# Patient Record
Sex: Female | Born: 1955 | Race: White | Hispanic: No | Marital: Married | Smoking: Never smoker
Health system: Southern US, Community
[De-identification: ages and names within clinical notes are randomized; demographics above are authoritative.]

## PROBLEM LIST (undated history)

## (undated) DIAGNOSIS — I10 Essential (primary) hypertension: Secondary | ICD-10-CM

## (undated) DIAGNOSIS — R233 Spontaneous ecchymoses: Secondary | ICD-10-CM

## (undated) DIAGNOSIS — M1990 Unspecified osteoarthritis, unspecified site: Secondary | ICD-10-CM

## (undated) DIAGNOSIS — I8390 Asymptomatic varicose veins of unspecified lower extremity: Secondary | ICD-10-CM

## (undated) DIAGNOSIS — K219 Gastro-esophageal reflux disease without esophagitis: Secondary | ICD-10-CM

## (undated) DIAGNOSIS — E119 Type 2 diabetes mellitus without complications: Secondary | ICD-10-CM

## (undated) DIAGNOSIS — R112 Nausea with vomiting, unspecified: Secondary | ICD-10-CM

## (undated) DIAGNOSIS — Z9889 Other specified postprocedural states: Secondary | ICD-10-CM

## (undated) DIAGNOSIS — R238 Other skin changes: Secondary | ICD-10-CM

## (undated) DIAGNOSIS — R9431 Abnormal electrocardiogram [ECG] [EKG]: Secondary | ICD-10-CM

## (undated) HISTORY — PX: ABDOMINAL HYSTERECTOMY: SHX81

## (undated) HISTORY — PX: OTHER SURGICAL HISTORY: SHX169

## (undated) HISTORY — PX: COLONOSCOPY: SHX174

## (undated) HISTORY — PX: WISDOM TOOTH EXTRACTION: SHX21

---

## 1999-10-10 ENCOUNTER — Other Ambulatory Visit: Admission: RE | Admit: 1999-10-10 | Discharge: 1999-10-10 | Payer: Self-pay | Admitting: Obstetrics and Gynecology

## 2000-03-05 ENCOUNTER — Encounter: Payer: Self-pay | Admitting: Obstetrics and Gynecology

## 2000-03-05 ENCOUNTER — Encounter: Admission: RE | Admit: 2000-03-05 | Discharge: 2000-03-05 | Payer: Self-pay | Admitting: Obstetrics and Gynecology

## 2000-10-15 ENCOUNTER — Other Ambulatory Visit: Admission: RE | Admit: 2000-10-15 | Discharge: 2000-10-15 | Payer: Self-pay | Admitting: Obstetrics and Gynecology

## 2001-04-22 ENCOUNTER — Encounter: Admission: RE | Admit: 2001-04-22 | Discharge: 2001-04-22 | Payer: Self-pay | Admitting: Obstetrics and Gynecology

## 2001-04-22 ENCOUNTER — Encounter: Payer: Self-pay | Admitting: Obstetrics and Gynecology

## 2001-10-28 ENCOUNTER — Other Ambulatory Visit: Admission: RE | Admit: 2001-10-28 | Discharge: 2001-10-28 | Payer: Self-pay | Admitting: Obstetrics and Gynecology

## 2002-04-28 ENCOUNTER — Encounter: Payer: Self-pay | Admitting: Obstetrics and Gynecology

## 2002-04-28 ENCOUNTER — Encounter: Admission: RE | Admit: 2002-04-28 | Discharge: 2002-04-28 | Payer: Self-pay | Admitting: Obstetrics and Gynecology

## 2002-05-05 ENCOUNTER — Encounter: Payer: Self-pay | Admitting: Obstetrics and Gynecology

## 2002-05-05 ENCOUNTER — Encounter: Admission: RE | Admit: 2002-05-05 | Discharge: 2002-05-05 | Payer: Self-pay | Admitting: Obstetrics and Gynecology

## 2003-05-11 ENCOUNTER — Encounter: Payer: Self-pay | Admitting: Obstetrics and Gynecology

## 2003-05-11 ENCOUNTER — Encounter: Admission: RE | Admit: 2003-05-11 | Discharge: 2003-05-11 | Payer: Self-pay | Admitting: Obstetrics and Gynecology

## 2003-07-23 ENCOUNTER — Other Ambulatory Visit: Admission: RE | Admit: 2003-07-23 | Discharge: 2003-07-23 | Payer: Self-pay | Admitting: Obstetrics and Gynecology

## 2004-05-16 ENCOUNTER — Encounter: Admission: RE | Admit: 2004-05-16 | Discharge: 2004-05-16 | Payer: Self-pay | Admitting: Obstetrics and Gynecology

## 2005-05-24 ENCOUNTER — Encounter: Admission: RE | Admit: 2005-05-24 | Discharge: 2005-05-24 | Payer: Self-pay | Admitting: Obstetrics and Gynecology

## 2006-06-07 ENCOUNTER — Encounter: Admission: RE | Admit: 2006-06-07 | Discharge: 2006-06-07 | Payer: Self-pay | Admitting: Obstetrics and Gynecology

## 2007-06-10 ENCOUNTER — Encounter: Admission: RE | Admit: 2007-06-10 | Discharge: 2007-06-10 | Payer: Self-pay | Admitting: Obstetrics and Gynecology

## 2008-06-12 ENCOUNTER — Encounter: Admission: RE | Admit: 2008-06-12 | Discharge: 2008-06-12 | Payer: Self-pay | Admitting: Obstetrics and Gynecology

## 2009-06-16 ENCOUNTER — Encounter: Admission: RE | Admit: 2009-06-16 | Discharge: 2009-06-16 | Payer: Self-pay | Admitting: Obstetrics and Gynecology

## 2009-06-22 ENCOUNTER — Encounter: Admission: RE | Admit: 2009-06-22 | Discharge: 2009-06-22 | Payer: Self-pay | Admitting: Obstetrics and Gynecology

## 2010-06-20 ENCOUNTER — Encounter: Admission: RE | Admit: 2010-06-20 | Discharge: 2010-06-20 | Payer: Self-pay | Admitting: Obstetrics and Gynecology

## 2010-10-05 ENCOUNTER — Encounter: Admission: RE | Admit: 2010-10-05 | Discharge: 2010-10-05 | Payer: Self-pay | Admitting: Obstetrics and Gynecology

## 2010-12-26 ENCOUNTER — Encounter: Payer: Self-pay | Admitting: Obstetrics and Gynecology

## 2011-05-22 ENCOUNTER — Other Ambulatory Visit: Payer: Self-pay | Admitting: Obstetrics and Gynecology

## 2011-05-22 DIAGNOSIS — Z1231 Encounter for screening mammogram for malignant neoplasm of breast: Secondary | ICD-10-CM

## 2011-06-28 ENCOUNTER — Ambulatory Visit: Payer: Self-pay

## 2011-07-03 ENCOUNTER — Ambulatory Visit
Admission: RE | Admit: 2011-07-03 | Discharge: 2011-07-03 | Disposition: A | Discharge status: Home / Self Care | Payer: Commercial Indemnity | Source: Ambulatory Visit | Attending: Obstetrics and Gynecology | Admitting: Obstetrics and Gynecology

## 2011-07-03 DIAGNOSIS — Z1231 Encounter for screening mammogram for malignant neoplasm of breast: Secondary | ICD-10-CM

## 2012-06-12 ENCOUNTER — Other Ambulatory Visit: Payer: Self-pay | Admitting: Obstetrics and Gynecology

## 2012-06-12 DIAGNOSIS — M8580 Other specified disorders of bone density and structure, unspecified site: Secondary | ICD-10-CM

## 2012-06-12 DIAGNOSIS — Z1231 Encounter for screening mammogram for malignant neoplasm of breast: Secondary | ICD-10-CM

## 2012-10-07 ENCOUNTER — Ambulatory Visit
Admission: RE | Admit: 2012-10-07 | Discharge: 2012-10-07 | Disposition: A | Discharge status: Home / Self Care | Payer: Commercial Indemnity | Source: Ambulatory Visit | Attending: Obstetrics and Gynecology | Admitting: Obstetrics and Gynecology

## 2012-10-07 DIAGNOSIS — Z1231 Encounter for screening mammogram for malignant neoplasm of breast: Secondary | ICD-10-CM

## 2012-10-07 DIAGNOSIS — M8580 Other specified disorders of bone density and structure, unspecified site: Secondary | ICD-10-CM

## 2013-09-08 ENCOUNTER — Other Ambulatory Visit: Payer: Self-pay

## 2013-09-08 DIAGNOSIS — Z1231 Encounter for screening mammogram for malignant neoplasm of breast: Secondary | ICD-10-CM

## 2013-10-10 ENCOUNTER — Ambulatory Visit: Payer: Commercial Indemnity

## 2013-10-29 ENCOUNTER — Ambulatory Visit
Admission: RE | Admit: 2013-10-29 | Discharge: 2013-10-29 | Disposition: A | Discharge status: Home / Self Care | Payer: Managed Care, Other (non HMO) | Source: Ambulatory Visit

## 2013-10-29 DIAGNOSIS — Z1231 Encounter for screening mammogram for malignant neoplasm of breast: Secondary | ICD-10-CM

## 2013-10-29 NOTE — Progress Notes (Signed)
Need orders in EPIC.  Surgery scheduled for 11/05/13.  Thank You.

## 2013-10-31 ENCOUNTER — Encounter (HOSPITAL_COMMUNITY): Payer: Self-pay | Admitting: Pharmacy Technician

## 2013-11-04 ENCOUNTER — Other Ambulatory Visit: Payer: Self-pay | Admitting: Orthopedic Surgery

## 2013-11-04 ENCOUNTER — Encounter (HOSPITAL_COMMUNITY)
Admission: RE | Admit: 2013-11-04 | Discharge: 2013-11-04 | Disposition: A | Discharge status: Home / Self Care | Payer: Managed Care, Other (non HMO) | Source: Ambulatory Visit | Attending: Orthopedic Surgery | Admitting: Orthopedic Surgery

## 2013-11-04 ENCOUNTER — Encounter (HOSPITAL_COMMUNITY): Payer: Self-pay

## 2013-11-04 ENCOUNTER — Encounter (INDEPENDENT_AMBULATORY_CARE_PROVIDER_SITE_OTHER): Payer: Self-pay

## 2013-11-04 ENCOUNTER — Other Ambulatory Visit: Payer: Self-pay

## 2013-11-04 DIAGNOSIS — I8390 Asymptomatic varicose veins of unspecified lower extremity: Secondary | ICD-10-CM

## 2013-11-04 HISTORY — DX: Spontaneous ecchymoses: R23.3

## 2013-11-04 HISTORY — DX: Nausea with vomiting, unspecified: R11.2

## 2013-11-04 HISTORY — DX: Unspecified osteoarthritis, unspecified site: M19.90

## 2013-11-04 HISTORY — DX: Asymptomatic varicose veins of unspecified lower extremity: I83.90

## 2013-11-04 HISTORY — DX: Other skin changes: R23.8

## 2013-11-04 HISTORY — DX: Essential (primary) hypertension: I10

## 2013-11-04 HISTORY — DX: Abnormal electrocardiogram (ECG) (EKG): R94.31

## 2013-11-04 HISTORY — DX: Other specified postprocedural states: Z98.890

## 2013-11-04 LAB — BASIC METABOLIC PANEL
Chloride: 99 mEq/L (ref 96–112)
Creatinine, Ser: 0.89 mg/dL (ref 0.50–1.10)
GFR calc Af Amer: 82 mL/min — ABNORMAL LOW (ref >90)
Sodium: 136 mEq/L (ref 135–145)

## 2013-11-04 LAB — CBC
Hemoglobin: 13 g/dL (ref 12.0–15.0)
MCV: 92.1 fL (ref 78.0–100.0)
RDW: 12.4 % (ref 11.5–15.5)
WBC: 6 10*3/uL (ref 4.0–10.5)

## 2013-11-04 NOTE — H&P (Signed)
  CC- Michaela Wood is a 56 y.o. female who presents with right knee pain.  HPI- . Knee Pain: Patient presents with knee pain involving the  right knee. Onset of the symptoms was several years ago. Inciting event: none known. Current symptoms include giving out, pain located medially, stiffness and swelling. Pain is aggravated by lateral movements, pivoting, rising after sitting and squatting.  Patient has had no prior knee problems. Evaluation to date: MRI: abnormal medial and lateral meniscal tears. Treatment to date: corticosteroid injection which was not very effective.  Past Medical History  Diagnosis Date  . PONV (postoperative nausea and vomiting)     only with wisdom teeth extraction  . Hypertension   . Abnormal EKG     "told bundle branch blockage"-EKG brought in 09-11-07 copy with chart  . Varicose veins 11-04-13    oral varicose veins-surgically excised  . Arthritis   . Bruises easily     hx. of. Fell" knee gave way" Right cheek with recent suture removal  . Varicose veins 11-04-13    bilateral legs-no problems    Past Surgical History  Procedure Laterality Date  . Colonoscopy    . Wisdom tooth extraction    . Childbirth      x2 NVD    Prior to Admission medications   Medication Sig Start Date End Date Taking? Authorizing Provider  Estradiol-Norethindrone Acet (ACTIVELLA) 0.5-0.1 MG per tablet Take 1 tablet by mouth daily.   Yes Historical Provider, MD  lisinopril-hydrochlorothiazide (PRINZIDE,ZESTORETIC) 20-25 MG per tablet Take 0.5 tablets by mouth daily with breakfast.   Yes Historical Provider, MD  etodolac (LODINE) 400 MG tablet Take 400 mg by mouth 2 (two) times daily.    Historical Provider, MD   KNEE EXAM antalgic gait, soft tissue tenderness over medial and lateral joint lines, effusion, negative drawer sign, collateral ligaments intact  Physical Examination: General appearance - alert, well appearing, and in no distress Mental status - alert, oriented to  person, place, and time Chest - clear to auscultation, no wheezes, rales or rhonchi, symmetric air entry Heart - normal rate, regular rhythm, normal S1, S2, no murmurs, rubs, clicks or gallops Abdomen - soft, nontender, nondistended, no masses or organomegaly Neurological - alert, oriented, normal speech, no focal findings or movement disorder noted   Asessment/Plan--- Right knee medial and lateral meniscal tears- - Plan right knee arthroscopy with meniscal debridement. Procedure risks and potential comps discussed with patient who elects to proceed. Goals are decreased pain and increased function with a high likelihood of achieving both

## 2013-11-04 NOTE — Progress Notes (Signed)
Preoperative surgical orders have been place into the Epic hospital system for Michaela Wood on 11/04/2013, 10:18 AM  by Patrica Duel for surgery on 11-05-2013.  Preop Knee Scope orders including IV Tylenol and IV Decadron as long as there are no contraindications to the above medications. Avel Peace, PA-C

## 2013-11-04 NOTE — Pre-Procedure Instructions (Signed)
11-04-13 EKG done

## 2013-11-04 NOTE — Patient Instructions (Addendum)
20 Michaela Wood  11/04/2013   Your procedure is scheduled on:   11-05-2013  Report to [facility] at   2:00      PM.  Call this number if you have problems the morning of surgery: [phone]  Or Presurgical Testing [phone](Heliodoro Domagalski)      Do not eat food:After Midnight.  May have clear liquids:up to 6 Hours before arrival. Nothing after : 1000 AM  Clear liquids include soda, tea, black coffee, apple or grape juice, broth.  Take these medicines the morning of surgery with A SIP OF WATER: none.   Do not wear jewelry, make-up or nail polish.  Do not wear lotions, powders, or perfumes. You may wear deodorant.  Do not shave 12 hours prior to first CHG shower(legs and under arms).(face and neck okay.)  Do not bring valuables to the hospital.  Contacts, dentures or removable bridgework, body piercing, hair pins may not be worn into surgery.  Leave suitcase in the car. After surgery it may be brought to your room.  For patients admitted to the hospital, checkout time is 11:00 AM the day of discharge.   Patients discharged the day of surgery will not be allowed to drive home. Must have responsible person with you x 24 hours once discharged.  Name and phone number of your driver: [name] -spouse [phone] cell  Special Instructions: CHG(Chlorhedine 4%-"Hibiclens","Betasept","Aplicare") Shower Use Special Wash: see special instructions.(avoid face and genitals)   Please read over the following fact sheets that you were given:Incentive Spirometry Instruction.    Failure to follow these instructions may result in Cancellation of your surgery.   Patient signature_______________________________________________________

## 2013-11-04 NOTE — Progress Notes (Signed)
11-04-13 1515 Candy of Dr. Deri Fuelling office to inform [employer]. Taking Etodolac for pain, needs to be informed whether to continue or hold until after surgery. Pt. Home # [phone]. W. Kennon Portela

## 2013-11-05 ENCOUNTER — Encounter (HOSPITAL_COMMUNITY)
Admission: RE | Disposition: A | Discharge status: Home / Self Care | Payer: Self-pay | Source: Ambulatory Visit | Attending: Orthopedic Surgery

## 2013-11-05 ENCOUNTER — Encounter (HOSPITAL_COMMUNITY): Payer: Self-pay | Admitting: *Deleted

## 2013-11-05 ENCOUNTER — Ambulatory Visit (HOSPITAL_COMMUNITY)
Admission: RE | Admit: 2013-11-05 | Discharge: 2013-11-05 | Disposition: A | Discharge status: Home / Self Care | Payer: Managed Care, Other (non HMO) | Source: Ambulatory Visit | Attending: Orthopedic Surgery | Admitting: Orthopedic Surgery

## 2013-11-05 ENCOUNTER — Ambulatory Visit (HOSPITAL_COMMUNITY): Payer: Managed Care, Other (non HMO)

## 2013-11-05 ENCOUNTER — Ambulatory Visit (HOSPITAL_COMMUNITY): Payer: Managed Care, Other (non HMO) | Admitting: Anesthesiology

## 2013-11-05 ENCOUNTER — Encounter (HOSPITAL_COMMUNITY): Payer: Managed Care, Other (non HMO) | Admitting: Anesthesiology

## 2013-11-05 DIAGNOSIS — S83249A Other tear of medial meniscus, current injury, unspecified knee, initial encounter: Secondary | ICD-10-CM

## 2013-11-05 DIAGNOSIS — S83289A Other tear of lateral meniscus, current injury, unspecified knee, initial encounter: Secondary | ICD-10-CM | POA: Insufficient documentation

## 2013-11-05 DIAGNOSIS — M9420 Chondromalacia, unspecified site: Secondary | ICD-10-CM | POA: Insufficient documentation

## 2013-11-05 DIAGNOSIS — S83281A Other tear of lateral meniscus, current injury, right knee, initial encounter: Secondary | ICD-10-CM

## 2013-11-05 DIAGNOSIS — M47814 Spondylosis without myelopathy or radiculopathy, thoracic region: Secondary | ICD-10-CM | POA: Insufficient documentation

## 2013-11-05 DIAGNOSIS — X58XXXA Exposure to other specified factors, initial encounter: Secondary | ICD-10-CM | POA: Insufficient documentation

## 2013-11-05 HISTORY — PX: KNEE ARTHROSCOPY: SHX127

## 2013-11-05 SURGERY — ARTHROSCOPY, KNEE
Anesthesia: General | Site: Knee | Laterality: Right

## 2013-11-05 MED ORDER — DEXAMETHASONE SODIUM PHOSPHATE 10 MG/ML IJ SOLN
10.0000 mg | Freq: Once | INTRAMUSCULAR | Status: AC
  Administered 2013-11-05: 10 mg via INTRAVENOUS

## 2013-11-05 MED ORDER — BUPIVACAINE-EPINEPHRINE 0.25% -1:200000 IJ SOLN
INTRAMUSCULAR | Status: DC | PRN
  Administered 2013-11-05: 20 mL

## 2013-11-05 MED ORDER — DEXAMETHASONE SODIUM PHOSPHATE 10 MG/ML IJ SOLN
INTRAMUSCULAR | Status: AC
  Filled 2013-11-05: qty 1

## 2013-11-05 MED ORDER — ACETAMINOPHEN 10 MG/ML IV SOLN
1000.0000 mg | Freq: Once | INTRAVENOUS | Status: AC
  Administered 2013-11-05: 1000 mg via INTRAVENOUS
  Filled 2013-11-05: qty 100

## 2013-11-05 MED ORDER — MIDAZOLAM HCL 2 MG/2ML IJ SOLN
INTRAMUSCULAR | Status: AC
  Filled 2013-11-05: qty 2

## 2013-11-05 MED ORDER — FENTANYL CITRATE 0.05 MG/ML IJ SOLN
INTRAMUSCULAR | Status: AC
  Filled 2013-11-05: qty 5

## 2013-11-05 MED ORDER — MIDAZOLAM HCL 5 MG/5ML IJ SOLN
INTRAMUSCULAR | Status: DC | PRN
  Administered 2013-11-05 (×2): 1 mg via INTRAVENOUS

## 2013-11-05 MED ORDER — FENTANYL CITRATE 0.05 MG/ML IJ SOLN
INTRAMUSCULAR | Status: DC | PRN
  Administered 2013-11-05 (×3): 50 ug via INTRAVENOUS

## 2013-11-05 MED ORDER — ONDANSETRON HCL 4 MG/2ML IJ SOLN
INTRAMUSCULAR | Status: DC | PRN
  Administered 2013-11-05: 4 mg via INTRAVENOUS

## 2013-11-05 MED ORDER — LACTATED RINGERS IR SOLN
Status: DC | PRN
  Administered 2013-11-05: 3000 mL

## 2013-11-05 MED ORDER — CHLORHEXIDINE GLUCONATE 4 % EX LIQD: 60.0000 mL | Freq: Once | CUTANEOUS | Status: DC

## 2013-11-05 MED ORDER — HYDROMORPHONE HCL 2 MG PO TABS: 2.0000 mg | ORAL_TABLET | ORAL | Status: DC | PRN

## 2013-11-05 MED ORDER — PROMETHAZINE HCL 25 MG/ML IJ SOLN: 6.2500 mg | INTRAMUSCULAR | Status: DC | PRN

## 2013-11-05 MED ORDER — PROPOFOL 10 MG/ML IV BOLUS
INTRAVENOUS | Status: AC
  Filled 2013-11-05: qty 20

## 2013-11-05 MED ORDER — FENTANYL CITRATE 0.05 MG/ML IJ SOLN
25.0000 ug | INTRAMUSCULAR | Status: DC | PRN
  Administered 2013-11-05 (×4): 25 ug via INTRAVENOUS

## 2013-11-05 MED ORDER — ONDANSETRON HCL 4 MG/2ML IJ SOLN
INTRAMUSCULAR | Status: AC
  Filled 2013-11-05: qty 2

## 2013-11-05 MED ORDER — FENTANYL CITRATE 0.05 MG/ML IJ SOLN
INTRAMUSCULAR | Status: AC
  Filled 2013-11-05: qty 2

## 2013-11-05 MED ORDER — PROPOFOL 10 MG/ML IV BOLUS
INTRAVENOUS | Status: DC | PRN
  Administered 2013-11-05: 30 mg via INTRAVENOUS
  Administered 2013-11-05: 70 mg via INTRAVENOUS
  Administered 2013-11-05: 130 mg via INTRAVENOUS

## 2013-11-05 MED ORDER — CEFAZOLIN SODIUM-DEXTROSE 2-3 GM-% IV SOLR
INTRAVENOUS | Status: AC
  Filled 2013-11-05: qty 50

## 2013-11-05 MED ORDER — BUPIVACAINE-EPINEPHRINE PF 0.25-1:200000 % IJ SOLN
INTRAMUSCULAR | Status: AC
  Filled 2013-11-05: qty 30

## 2013-11-05 MED ORDER — LIDOCAINE HCL (CARDIAC) 20 MG/ML IV SOLN
INTRAVENOUS | Status: DC | PRN
  Administered 2013-11-05: 60 mg via INTRAVENOUS

## 2013-11-05 MED ORDER — METHOCARBAMOL 500 MG PO TABS: 500.0000 mg | ORAL_TABLET | Freq: Four times a day (QID) | ORAL | Status: DC

## 2013-11-05 MED ORDER — LIDOCAINE HCL (CARDIAC) 20 MG/ML IV SOLN
INTRAVENOUS | Status: AC
  Filled 2013-11-05: qty 5

## 2013-11-05 MED ORDER — 0.9 % SODIUM CHLORIDE (POUR BTL) OPTIME
TOPICAL | Status: DC | PRN
  Administered 2013-11-05: 1000 mL

## 2013-11-05 MED ORDER — SODIUM CHLORIDE 0.9 % IV SOLN: INTRAVENOUS | Status: DC

## 2013-11-05 MED ORDER — LACTATED RINGERS IV SOLN
INTRAVENOUS | Status: DC
  Administered 2013-11-05: 1000 mL via INTRAVENOUS

## 2013-11-05 MED ORDER — CEFAZOLIN SODIUM-DEXTROSE 2-3 GM-% IV SOLR
2.0000 g | INTRAVENOUS | Status: AC
  Administered 2013-11-05: 2 g via INTRAVENOUS

## 2013-11-05 SURGICAL SUPPLY — 30 items
BANDAGE ELASTIC 6 VELCRO ST LF (GAUZE/BANDAGES/DRESSINGS) ×2 IMPLANT
BLADE 4.2CUDA (BLADE) ×2 IMPLANT
CLOTH BEACON ORANGE TIMEOUT ST (SAFETY) ×2 IMPLANT
COUNTER NEEDLE 20 DBL MAG RED (NEEDLE) ×2 IMPLANT
CUFF TOURN SGL QUICK 34 (TOURNIQUET CUFF) ×1
CUFF TRNQT CYL 34X4X40X1 (TOURNIQUET CUFF) ×1 IMPLANT
DRAPE U-SHAPE 47X51 STRL (DRAPES) ×2 IMPLANT
DRSG ADAPTIC 3X8 NADH LF (GAUZE/BANDAGES/DRESSINGS) ×2 IMPLANT
DRSG EMULSION OIL 3X3 NADH (GAUZE/BANDAGES/DRESSINGS) ×2 IMPLANT
DURAPREP 26ML APPLICATOR (WOUND CARE) ×2 IMPLANT
GLOVE BIO SURGEON STRL SZ8 (GLOVE) ×2 IMPLANT
GLOVE BIOGEL PI IND STRL 8 (GLOVE) ×1 IMPLANT
GLOVE BIOGEL PI INDICATOR 8 (GLOVE) ×1
GLOVE SURG SS PI 8.5 STRL IVOR (GLOVE) ×1
GLOVE SURG SS PI 8.5 STRL STRW (GLOVE) ×1 IMPLANT
GOWN PREVENTION PLUS LG XLONG (DISPOSABLE) ×4 IMPLANT
IV NS IRRIG 3000ML ARTHROMATIC (IV SOLUTION) ×6 IMPLANT
MANIFOLD NEPTUNE II (INSTRUMENTS) ×2 IMPLANT
PACK ARTHROSCOPY WL (CUSTOM PROCEDURE TRAY) ×2 IMPLANT
PACK ICE MAXI GEL EZY WRAP (MISCELLANEOUS) ×6 IMPLANT
PAD ABD 8X10 STRL (GAUZE/BANDAGES/DRESSINGS) ×2 IMPLANT
PADDING CAST COTTON 6X4 STRL (CAST SUPPLIES) ×4 IMPLANT
POSITIONER SURGICAL ARM (MISCELLANEOUS) ×2 IMPLANT
SET ARTHROSCOPY TUBING (MISCELLANEOUS) ×1
SET ARTHROSCOPY TUBING LN (MISCELLANEOUS) ×1 IMPLANT
SUT ETHILON 4 0 PS 2 18 (SUTURE) ×2 IMPLANT
SYR 20CC LL (SYRINGE) ×2 IMPLANT
TOWEL OR 17X26 10 PK STRL BLUE (TOWEL DISPOSABLE) ×2 IMPLANT
WAND 90 DEG TURBOVAC W/CORD (SURGICAL WAND) ×2 IMPLANT
WRAP KNEE MAXI GEL POST OP (GAUZE/BANDAGES/DRESSINGS) ×2 IMPLANT

## 2013-11-05 NOTE — Interval H&P Note (Signed)
History and Physical Interval Note:  11/05/2013 5:03 PM  Michaela Wood  has presented today for surgery, with the diagnosis of RIGHT KNEE MEDIAL/LATERAL MENISCUS TEAR  The various methods of treatment have been discussed with the patient and family. After consideration of risks, benefits and other options for treatment, the patient has consented to  Procedure(s): RIGHT ARTHROSCOPY KNEE WITH DEBRIDEMENT (Right) as a surgical intervention .  The patient's history has been reviewed, patient examined, no change in status, stable for surgery.  I have reviewed the patient's chart and labs.  Questions were answered to the patient's satisfaction.     Loanne Drilling

## 2013-11-05 NOTE — Preoperative (Signed)
Beta Blockers   Reason not to administer Beta Blockers:Not Applicable 

## 2013-11-05 NOTE — Anesthesia Postprocedure Evaluation (Signed)
  Anesthesia Post-op Note  Patient: Michaela Wood  Procedure(s) Performed: Procedure(s) (LRB): RIGHT ARTHROSCOPY KNEE WITHLATERAL MENISCUS  DEBRIDEMENT (Right)  Patient Location: PACU  Anesthesia Type: General  Level of Consciousness: awake and alert   Airway and Oxygen Therapy: Patient Spontanous Breathing  Post-op Pain: mild  Post-op Assessment: Post-op Vital signs reviewed, Patient's Cardiovascular Status Stable, Respiratory Function Stable, Patent Airway and No signs of Nausea or vomiting  Last Vitals:  Filed Vitals:   11/05/13 1935  BP: 110/65  Pulse: 90  Temp: 36.2 C  Resp: 18    Post-op Vital Signs: stable   Complications: No apparent anesthesia complications

## 2013-11-05 NOTE — Anesthesia Preprocedure Evaluation (Addendum)
Anesthesia Evaluation  Patient identified by MRN, date of birth, ID band Patient awake  General Assessment Comment:PONV (postoperative nausea and vomiting)         only with wisdom teeth extraction   .  Hypertension     .  Abnormal EKG         "told bundle branch blockage"-EKG brought in 09-11-07 copy with chart   .  Varicose veins  11-04-13       oral varicose veins-surgically excised   .  Arthritis     .  Bruises easily         hx. of. Fell" knee gave way" Right cheek with recent suture removal   .  Varicose veins  11-04-13       bilateral legs-no problems     Reviewed: Allergy & Precautions, H&P , NPO status , Patient's Chart, lab work & pertinent test results  History of Anesthesia Complications (+) PONV and history of anesthetic complications  Airway Mallampati: II TM Distance: >3 FB Neck ROM: Full    Dental no notable dental hx.    Pulmonary neg pulmonary ROS,  breath sounds clear to auscultation  Pulmonary exam normal       Cardiovascular hypertension, Pt. on medications negative cardio ROS  Rhythm:Regular Rate:Normal  ECG: Reviewed. NSR, ? LVH, septal Q waves.   Neuro/Psych negative neurological ROS  negative psych ROS   GI/Hepatic negative GI ROS, Neg liver ROS,   Endo/Other  negative endocrine ROS  Renal/GU negative Renal ROS  negative genitourinary   Musculoskeletal negative musculoskeletal ROS (+)   Abdominal   Peds negative pediatric ROS (+)  Hematology negative hematology ROS (+)   Anesthesia Other Findings   Reproductive/Obstetrics negative OB ROS                          Anesthesia Physical Anesthesia Plan  ASA: II  Anesthesia Plan: General   Post-op Pain Management:    Induction: Intravenous  Airway Management Planned: LMA  Additional Equipment:   Intra-op Plan:   Post-operative Plan: Extubation in OR  Informed Consent: I have reviewed the patients  History and Physical, chart, labs and discussed the procedure including the risks, benefits and alternatives for the proposed anesthesia with the patient or authorized representative who has indicated his/her understanding and acceptance.   Dental advisory given  Plan Discussed with: CRNA  Anesthesia Plan Comments:         Anesthesia Quick Evaluation

## 2013-11-05 NOTE — Op Note (Signed)
Preoperative diagnosis-  Right knee lateral meniscal tear  Postoperative diagnosis Right- knee lateral meniscal tear   Procedure- Right knee arthroscopy with lateral meniscal debridement    Surgeon- Gus Rankin. Amila Callies, MD  Anesthesia-General  EBL-  minimal Complications- None  Condition- PACU - hemodynamically stable.  Brief clinical note- -Michaela Wood is a 57 y.o.  female with a several month history of right knee pain and mechanical symptoms. Exam and history suggested lateral meniscal tear confirmed by MRI. The patient presents now for arthroscopy and debridement   Procedure in detail -       After successful administration of General anesthetic, a tourmiquet is placed high on the Right  thigh and the Right lower extremity is prepped and draped in the usual sterile fashion. Time out is performed by the surgical team. Standard superomedial and inferolateral portal sites are marked and incisions made with an 11 blade. The inflow cannula is passed through the superomedial portal and camera through the inferolateral portal and inflow is initiated. Arthroscopic visualization proceeds.      The undersurface of the patella and trochlea are visualized and there is grade II chondromalacia but no unstable cartilage defect. The medial and lateral gutters are visualized and there are   no loose bodies. Flexion and valgus force is applied to the knee and the medial compartment is entered. A spinal needle is passed into the joint through the site marked for the inferomedial portal. A small incision is made and the dilator passed into the joint. The findings for the medial compartment are normal . The MRI identified a possible medial meniscal tear but upon inspection of the medial compartment there was no tear.    The intercondylar notch is visualized and the ACL appears normal. The lateral compartment is entered and the findings are unstable tear of the lateral meniscus and grade IV changes of the  lateral femoral condyle and lateral tibial plateau . The tear is debrided to a stable base with baskets and a shaver and sealed off with the Arthrocare. The Grade IV changes were present throughout most of the lateral compartment.     The joint is again inspected and there are no other tears, defects or loose bodies identified. The arthroscopic equipment is then removed from the inferior portals which are closed with interrupted 4-0 nylon. 20 ml of .25% Marcaine with epinephrine are injected through the inflow cannula and the cannula is then removed and the portal closed with nylon. The incisions are cleaned and dried and a bulky sterile dressing is applied. The patient is then awakened and transported to recovery in stable condition.   11/05/2013, 5:52 PM

## 2013-11-05 NOTE — Transfer of Care (Signed)
Immediate Anesthesia Transfer of Care Note  Patient: Michaela Wood  Procedure(s) Performed: Procedure(s): RIGHT ARTHROSCOPY KNEE WITHLATERAL MENISCUS  DEBRIDEMENT (Right)  Patient Location: PACU  Anesthesia Type:General  Level of Consciousness: awake, alert , oriented and patient cooperative  Airway & Oxygen Therapy: Patient Spontanous Breathing and Patient connected to face mask oxygen  Post-op Assessment: Report given to PACU RN, Post -op Vital signs reviewed and stable and Patient moving all extremities  Post vital signs: Reviewed and stable  Complications: No apparent anesthesia complications

## 2013-11-06 ENCOUNTER — Encounter (HOSPITAL_COMMUNITY): Payer: Self-pay | Admitting: Orthopedic Surgery

## 2014-03-23 ENCOUNTER — Other Ambulatory Visit: Payer: Self-pay | Admitting: Family Medicine

## 2014-03-23 ENCOUNTER — Ambulatory Visit
Admission: RE | Admit: 2014-03-23 | Discharge: 2014-03-23 | Disposition: A | Discharge status: Home / Self Care | Payer: Managed Care, Other (non HMO) | Source: Ambulatory Visit | Attending: Family Medicine | Admitting: Family Medicine

## 2014-03-23 DIAGNOSIS — R05 Cough: Secondary | ICD-10-CM

## 2014-03-23 DIAGNOSIS — R059 Cough, unspecified: Secondary | ICD-10-CM

## 2014-05-05 ENCOUNTER — Ambulatory Visit (INDEPENDENT_AMBULATORY_CARE_PROVIDER_SITE_OTHER): Payer: Managed Care, Other (non HMO) | Admitting: Internal Medicine

## 2014-05-05 ENCOUNTER — Encounter: Payer: Self-pay | Admitting: Internal Medicine

## 2014-05-05 VITALS — BP 128/74 | HR 93 | Ht 61.0 in | Wt 112.4 lb

## 2014-05-05 DIAGNOSIS — R05 Cough: Secondary | ICD-10-CM

## 2014-05-05 DIAGNOSIS — R059 Cough, unspecified: Secondary | ICD-10-CM

## 2014-05-05 MED ORDER — BENZONATATE 200 MG PO CAPS: 200.0000 mg | ORAL_CAPSULE | Freq: Three times a day (TID) | ORAL | Status: DC | PRN

## 2014-05-05 NOTE — Patient Instructions (Addendum)
Order- Schedule Speech Therapist assisted Modified Barium Swallow     Dx cough with eating  Order- Schedule PFT    Dx cough  Script for benzonatate perles  sent

## 2014-05-05 NOTE — Assessment & Plan Note (Addendum)
Dry cough said to be present x 40 years. It may have had different causes over the years. Only real clue for now is husband's opinion that it is worse while eating. Cough bothers family, not her.  Plan Education, reflux precautions, PFT, MBS with Speech Therapy supervision for LPR/ reflux/ aspiration, benzonatate

## 2014-05-05 NOTE — Progress Notes (Signed)
05/05/14- 68 yoF[address] smoker Dr Elkins-chronic cough-happens with eating as well. Has been on Lisinopril in the past-was taken off and cough still happening. Husband Harvie Heck here.  They say she has had a dry cough little changed over 40 years. Treated with cough drops and Robitussin. Denies wheeze, dyspnea. No hx asthma. Little aware of postnasal drip but some seasonal rhinitis. Little GERD but husband says he and family notice she coughs more with meals. 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 No change when she was taken off Lisinopril over a month ago. Not worse lying down at night and not related to season or location.  Environment- house, no basement, no mold/ dampness, no pets or smokers. CXR 03/23/14- FINDINGS:  The cardiomediastinal silhouette is within normal limits. Lungs are  normally to slightly hypoinflated, unchanged. Minimal prominence of  the pulmonary interstitial markings is unchanged. There is no  evidence of airspace consolidation, edema, pleural effusion, or  pneumothorax. Mild multilevel thoracic spondylosis is again noted.  IMPRESSION:  No active cardiopulmonary disease.  Electronically Signed  By: Sebastian Ache  On: 03/23/2014 08:53  Prior to Admission medications   Medication Sig Start Date End Date Taking? Authorizing Provider  Calcium Carbonate-Vitamin D (CALCIUM 500 + D PO) Take by mouth daily.   Yes Historical Provider, MD  Estradiol-Norethindrone Acet (ACTIVELLA) 0.5-0.1 MG per tablet Take 1 tablet by mouth daily.   Yes Historical Provider, MD  etodolac (LODINE) 400 MG tablet Take 400 mg by mouth 2 (two) times daily.   Yes Historical Provider, MD  hydrochlorothiazide (HYDRODIURIL) 50 MG tablet Take 1 tablet by mouth daily. 04/27/14  Yes Historical Provider, MD  Multiple Vitamin (MULTIVITAMIN) tablet Take 1 tablet by mouth daily.   Yes Historical Provider, MD  multivitamin-lutein (OCUVITE-LUTEIN) CAPS capsule  Take 1 capsule by mouth daily.   Yes Historical Provider, MD  benzonatate (TESSALON) 200 MG capsule Take 1 capsule (200 mg total) by mouth 3 (three) times daily as needed for cough. 05/05/14   Waymon Budge, MD   Past Medical History  Diagnosis Date  . PONV (postoperative nausea and vomiting)     only with wisdom teeth extraction  . Hypertension   . Abnormal EKG     "told bundle branch blockage"-EKG brought in 09-11-07 copy with chart  . Varicose veins 11-04-13    oral varicose veins-surgically excised  . Arthritis   . Bruises easily     hx. of. Fell" knee gave way" Right cheek with recent suture removal  . Varicose veins 11-04-13    bilateral legs-no problems   Past Surgical History  Procedure Laterality Date  . Colonoscopy    . Wisdom tooth extraction    . Childbirth      x2 NVD  . Knee arthroscopy Right 11/05/2013    Procedure: RIGHT ARTHROSCOPY KNEE [facility] MENISCUS  DEBRIDEMENT;  Surgeon: Loanne Drilling, MD;  Location: [facility];  Service: Orthopedics;  Laterality: Right;   Family History  Problem Relation Age of Onset  . Heart disease Mother     left bundle block  . Diabetes Father   . Heart disease Son    History   Social History  . Marital Status: Married    Spouse Name: N/A    Number of Children: 2  . Years of Education: N/A   Occupational History  . plastic molding    Social History Main Topics  . Smoking status: Never Smoker   .  Smokeless tobacco: Not on file  . Alcohol Use: No  . Drug Use: No  . Sexual Activity: Yes   Other Topics Concern  . Not on file   Social History Narrative  . No narrative on file   ROS-see HPI Constitutional:   No-   weight loss, night sweats, fevers, chills, fatigue, lassitude. HEENT:   No-  headaches, difficulty swallowing, tooth/dental problems, sore throat,       +  sneezing, +itching, ear ache, nasal congestion, post nasal drip,  CV:  No-   chest pain, orthopnea, PND, +swelling in lower extremities, anasarca,                                   dizziness, palpitations Resp: No-   shortness of breath with exertion or at rest.             + productive cough,  No non-productive cough,  No- coughing up of blood.              No-   change in color of mucus.  No- wheezing.   Skin: No-   rash or lesions. GI:  No-   heartburn, indigestion, abdominal pain, nausea, vomiting, diarrhea,                 change in bowel habits, loss of appetite GU: No-   dysuria, change in color of urine, no urgency or frequency.  No- flank pain. MS:  +joint pain or swelling.  No- decreased range of motion.  No- back pain. Neuro-     nothing unusual Psych:  No- change in mood or affect. No depression or anxiety.  No memory loss.  OBJ- Physical Exam General- Alert, Oriented, Affect-appropriate, Distress- none acute Skin- rash-none, lesions- none, excoriation- none Lymphadenopathy- none Head- atraumatic            Eyes- Gross vision intact, PERRLA, conjunctivae and secretions clear            Ears- Hearing, canals-normal            Nose- Clear, no-Septal dev, mucus, polyps, erosion, perforation             Throat- Mallampati II-III , mucosa clear , drainage- none, tonsils- atrophic, +Partial plate,                             +active gag Neck- flexible , trachea midline, no stridor , thyroid nl, carotid no bruit Chest - symmetrical excursion , unlabored           Heart/CV- RRR , no murmur , no gallop  , no rub, nl s1 s2                           - JVD- none , edema- none, stasis changes- none, varices- none           Lung- clear to P&A, wheeze- none, cough- none , dullness-none, rub- none           Chest wall- + mild kyphosis Abd- tender-no, distended-no, bowel sounds-present, HSM- no Br/ Gen/ Rectal- Not done, not indicated Extrem- cyanosis- none, clubbing, none, atrophy- none, strength- nl Neuro- grossly intact to observation

## 2014-05-07 ENCOUNTER — Other Ambulatory Visit (HOSPITAL_COMMUNITY): Payer: Self-pay | Admitting: Internal Medicine

## 2014-05-07 DIAGNOSIS — R059 Cough, unspecified: Secondary | ICD-10-CM

## 2014-05-07 DIAGNOSIS — R05 Cough: Secondary | ICD-10-CM

## 2014-05-07 DIAGNOSIS — R490 Dysphonia: Secondary | ICD-10-CM

## 2014-05-18 ENCOUNTER — Ambulatory Visit (HOSPITAL_COMMUNITY)
Admission: RE | Admit: 2014-05-18 | Discharge: 2014-05-18 | Disposition: A | Discharge status: Home / Self Care | Payer: Managed Care, Other (non HMO) | Source: Ambulatory Visit | Attending: Internal Medicine | Admitting: Internal Medicine

## 2014-05-18 DIAGNOSIS — R05 Cough: Secondary | ICD-10-CM

## 2014-05-18 DIAGNOSIS — R490 Dysphonia: Secondary | ICD-10-CM | POA: Insufficient documentation

## 2014-05-18 DIAGNOSIS — R059 Cough, unspecified: Secondary | ICD-10-CM

## 2014-05-18 MED ORDER — ALBUTEROL SULFATE (2.5 MG/3ML) 0.083% IN NEBU
2.5000 mg | INHALATION_SOLUTION | Freq: Once | RESPIRATORY_TRACT | Status: AC
  Administered 2014-05-18: 2.5 mg via RESPIRATORY_TRACT

## 2014-05-18 NOTE — Procedures (Signed)
Objective Swallowing Evaluation: Modified Barium Swallowing Study  Patient Details  Name: Michaela MarkerJoan E Vitanza MRN: [id number] Date of Birth: 04/13/1956  Today's Date: 05/18/2014 Time: 1340-1401 SLP Time Calculation (min): 21 min  Past Medical History:  Past Medical History  Diagnosis Date  . PONV (postoperative nausea and vomiting)     only with wisdom teeth extraction  . Hypertension   . Abnormal EKG     "told bundle branch blockage"-EKG brought in 09-11-07 copy with chart  . Varicose veins 11-04-13    oral varicose veins-surgically excised  . Arthritis   . Bruises easily     hx. of. Fell" knee gave way" Right cheek with recent suture removal  . Varicose veins 11-04-13    bilateral legs-no problems   Past Surgical History:  Past Surgical History  Procedure Laterality Date  . Colonoscopy    . Wisdom tooth extraction    . Childbirth      x2 NVD  . Knee arthroscopy Right 11/05/2013    [facility]: [facility];  Surgeon: Loanne DrillingFrank V Aluisio, MD;  Location: [facility];  Service: Orthopedics;  Laterality: Right;   HPI:  58 yo female referred by [facility] for MBSS due to pt's spouse report of pt coughing during intake.  Pt PMH + for HTN, arthritis, easily bruising, PONV, abnormal EKG.  Medication list includes Tessalon, multivitamin, caclium, lodine, activella, hydrodiuril.  Pt denies improvement with cough since using Tessalon pearls.   Spouse present and reports pt coughs all of the time.  She admits to eating within a few hours before bedtime due to working 12 hour shifts.  Pt has required heimlich manuever in the past after she choked on meat. - spouse states she was eating too fast.  Pt denies weight loss due to dysphagia nor recurrent pulmonary infections.       Assessment / Plan / Recommendation Clinical Impression  Dysphagia Diagnosis: Within Functional Limits  Clinical impression: Pt presents with functional oropharyngeal swallow ability  with timely and strong swallow and adequate airway protection.  Minimally prominent cricopharyngeus noted with appearance of minimally transient delayed clearance below UES.  Pt did not cough prior to or during MBS, subtle cough x1 noted after completion of test immediately after pt took a drink of water.    Reflux symptom index screen completed with pt scoring 14 - possibly indicating LPR.  Provided pt with compensation strategies and RSI screen to retest in one month for functional improvement.   Thanks for this referral.      Treatment Recommendation    n/a   Diet Recommendation Regular;Thin liquid   Liquid Administration via: Cup;Straw Medication Administration: Whole meds with liquid Supervision: Patient able to self feed Compensations: Slow rate;Small sips/bites Postural Changes and/or Swallow Maneuvers: Seated upright 90 degrees;Upright 30-60 min after meal    Other  Recommendations Oral Care Recommendations: Oral care BID   Follow Up Recommendations  None  Defer to referring MD     General Date of Onset: 05/18/14 HPI: 58 yo female [facility] for MBSS due to pt's spouse report of pt coughing during intake.  Pt PMH + for HTN, arthritis, easily bruising, PONV, abnormal EKG.  Medication list includes Tessalon, multivitamin, caclium, lodine, activella, hydrodiuril.  Pt denies improvement with cough since using Tessalon pearls.   Spouse present and reports pt coughs all of the time.  She admits to eating within a few hours before bedtime due to working 12 hour shifts.  Pt has required heimlich manuever in the past after she choked on meat. - spouse states she was eating too fast.  Pt denies weight loss due to dysphagia nor recurrent pulmonary infections.   Type of Study: Modified Barium Swallowing Study Reason for Referral: Objectively evaluate swallowing function Diet Prior to this Study: Regular;Thin liquids Temperature Spikes Noted: No Respiratory Status: Room  air History of Recent Intubation: No Behavior/Cognition: Alert;Cooperative;Pleasant mood Oral Cavity - Dentition: Adequate natural dentition (has partial, not brought in for test) Oral Motor / Sensory Function: Within functional limits Self-Feeding Abilities: Able to feed self Patient Positioning: Upright in bed Baseline Vocal Quality: Clear Volitional Cough: Strong Volitional Swallow: Able to elicit Anatomy: Within functional limits    Reason for Referral Objectively evaluate swallowing function   Oral Phase Oral Preparation/Oral Phase Oral Phase: WFL Oral - Nectar Oral - Nectar Cup: Within functional limits Oral - Thin Oral - Thin Cup: Within functional limits Oral - Solids Oral - Puree: Within functional limits Oral - Regular: Within functional limits Oral - Pill: Within functional limits   Pharyngeal Phase Pharyngeal Phase Pharyngeal Phase: Within functional limits Pharyngeal - Nectar Pharyngeal - Nectar Cup: Within functional limits Pharyngeal - Thin Pharyngeal - Thin Cup: Within functional limits Pharyngeal - Thin Straw: Within functional limits Pharyngeal - Solids Pharyngeal - Puree: Within functional limits Pharyngeal - Regular: Within functional limits Pharyngeal - Pill: Within functional limits  Cervical Esophageal Phase    GO    Cervical Esophageal Phase Cervical Esophageal Phase: WFL (appearance of minimal prominent cricopharyngeus with transient minimally delayed clearance just below - otherwise esophagus appeared clear upon sweep x3, radiologist not present to confirm findings) Cervical Esophageal Phase - Nectar Nectar Cup: Within functional limits Cervical Esophageal Phase - Thin Thin Cup: Within functional limits Thin Straw: Within functional limits Cervical Esophageal Phase - Solids Puree: Within functional limits Regular: Within functional limits Pill: Within functional limits    Functional Assessment Tool Used: mbs, clinical  judgement Functional Limitations: Swallowing Swallow Current Status ([id number](G8996): At least 1 percent but less than 20 percent impaired, limited or restricted Swallow Goal Status [phone]([id number]): At least 1 percent but less than 20 percent impaired, limited or restricted Swallow Discharge Status [phone]([id number]): At least 1 percent but less than 20 percent impaired, limited or restricted    Donavan Burnetamara Faryal Marxen, MS [facility] SLP [phone]

## 2014-05-20 LAB — PULMONARY FUNCTION TEST
DL/VA % pred: 108 %
DL/VA: 4.78 ml/min/mmHg/L
DLCO unc % pred: 86 %
DLCO unc: 17.52 ml/min/mmHg
FEF 25-75 PRE: 2.73 L/s
FEF 25-75 Post: 2.39 L/sec
FEF2575-%CHANGE-POST: -12 %
FEF2575-%PRED-PRE: 120 %
FEF2575-%Pred-Post: 105 %
FEV1-%Change-Post: -3 %
FEV1-%PRED-PRE: 93 %
FEV1-%Pred-Post: 89 %
FEV1-POST: 2.09 L
FEV1-Pre: 2.17 L
FEV1FVC-%CHANGE-POST: 4 %
FEV1FVC-%Pred-Pre: 109 %
FEV6-%CHANGE-POST: -7 %
FEV6-%Pred-Post: 81 %
FEV6-%Pred-Pre: 87 %
FEV6-Post: 2.35 L
FEV6-Pre: 2.53 L
FEV6FVC-%Change-Post: 0 %
FEV6FVC-%Pred-Post: 103 %
FEV6FVC-%Pred-Pre: 102 %
FVC-%CHANGE-POST: -7 %
FVC-%PRED-POST: 78 %
FVC-%Pred-Pre: 84 %
FVC-Post: 2.35 L
FVC-Pre: 2.55 L
POST FEV1/FVC RATIO: 89 %
Post FEV6/FVC ratio: 100 %
Pre FEV1/FVC ratio: 85 %
Pre FEV6/FVC Ratio: 99 %
RV % pred: 143 %
RV: 2.59 L
TLC % pred: 109 %
TLC: 5.05 L

## 2014-05-22 ENCOUNTER — Telehealth: Payer: Self-pay | Admitting: Internal Medicine

## 2014-05-22 NOTE — Telephone Encounter (Signed)
Called spoke with patient, advised of swallowing study results as stated by CDY: Result Notes    Notes Recorded by Ronny BaconKatie C Welchel, CMA on 05/21/2014 at 3:44 PM LMTCB ------  Notes Recorded by Waymon Budgelinton D Young, MD on 05/18/2014 at 5:16 PM Swallowing study- there may be some reflux at times, but this study was normal with no major problem identified.   Pt verbalized her understanding and denied any questions/concerns.  She would like to know the results of her recent PFT.  The results have been printed along with a copy of this telephone message.  Dr Maple HudsonYoung please advise, thank you.

## 2014-05-22 NOTE — Telephone Encounter (Signed)
Called spoke with patient and advised of PFT results as stated CDY.  Pt verbalized her understanding and denied any additional questions/concerns at this time.  She will keep her 7.13.15 ov with CDY.  Nothing further needed; will sign off.

## 2014-05-22 NOTE — Telephone Encounter (Signed)
Her pulmonary function test was completely normal, including tests for airflow, lung size and oxygen exchange.

## 2014-06-15 ENCOUNTER — Ambulatory Visit (INDEPENDENT_AMBULATORY_CARE_PROVIDER_SITE_OTHER): Payer: Managed Care, Other (non HMO) | Admitting: Internal Medicine

## 2014-06-15 ENCOUNTER — Encounter: Payer: Self-pay | Admitting: Internal Medicine

## 2014-06-15 VITALS — BP 128/78 | HR 74 | Ht 61.0 in | Wt 110.2 lb

## 2014-06-15 DIAGNOSIS — R05 Cough: Secondary | ICD-10-CM

## 2014-06-15 DIAGNOSIS — R059 Cough, unspecified: Secondary | ICD-10-CM

## 2014-06-15 MED ORDER — GABAPENTIN 100 MG PO CAPS: 100.0000 mg | ORAL_CAPSULE | Freq: Three times a day (TID) | ORAL | Status: AC

## 2014-06-15 MED ORDER — MOMETASONE FURO-FORMOTEROL FUM 100-5 MCG/ACT IN AERO: 2.0000 | INHALATION_SPRAY | Freq: Two times a day (BID) | RESPIRATORY_TRACT | Status: DC

## 2014-06-15 NOTE — Patient Instructions (Signed)
Script gabapentin to try for control of cough  Sample Dulera 100 inhaler with instruction   2 puffs, then rinse mouth, twice daily  Continue omeprazole for now and try to follow the Speech Therapist's advice

## 2014-06-15 NOTE — Progress Notes (Signed)
05/05/14- 54 yoFnever smoker Dr Elkins-chronic cough-happens with eating as well. Has been on Lisinopril in the past-was taken off and cough still happening. Husband Harvie Heck here.  They say she has had a dry cough little changed over 40 years. Treated with cough drops and Robitussin. Denies wheeze, dyspnea. No hx asthma. Little aware of postnasal drip but some seasonal rhinitis. Little GERD but husband says he and family notice she coughs more with meals. 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 No change when she was taken off Lisinopril over a month ago. Not worse lying down at night and not related to season or location.  Environment- house, no basement, no mold/ dampness, no pets or smokers. CXR 03/23/14- FINDINGS:  The cardiomediastinal silhouette is within normal limits. Lungs are  normally to slightly hypoinflated, unchanged. Minimal prominence of  the pulmonary interstitial markings is unchanged. There is no  evidence of airspace consolidation, edema, pleural effusion, or  pneumothorax. Mild multilevel thoracic spondylosis is again noted.  IMPRESSION:  No active cardiopulmonary disease.  Electronically Signed  By: Sebastian Ache  On: 03/23/2014 08:53   06/15/14- 58 yoFnever smoker Dr Elkins-chronic cough-happens with eating as well. Has been on Lisinopril in the past-was taken off and cough still happening. FOLLOWS FOR:  Still having cough, non-productive.  Reports not much improvement since last ov. She reduced her caffeine and is taking omeprazole. PFT: 05/18/2014-within normal limits, FEV1/FVC 0.89, TLC 109%, DLCO 86%. Modified Ba Swallow SLP 05/18/14 Dysphagia Diagnosis: Within Functional Limits  Clinical impression: Pt presents with functional oropharyngeal swallow ability with timely and strong swallow and adequate airway protection. Minimally prominent cricopharyngeus noted with appearance of minimally transient delayed  clearance below UES. Pt did not cough prior to or during MBS, subtle cough x1 noted after completion of test immediately after pt took a drink of water.  Reflux symptom index screen completed with pt scoring 14 - possibly indicating LPR. Provided pt with compensation strategies and RSI screen to retest in one month for functional improvement. Thanks for this referral.  Treatment Recommendation  n/a  Diet Recommendation Regular;Thin liquid   ROS-see HPI Constitutional:   No-   weight loss, night sweats, fevers, chills, fatigue, lassitude. HEENT:   No-  headaches, difficulty swallowing, tooth/dental problems, sore throat,    no- sneezing, no-itching, ear ache, nasal congestion, post nasal drip,  CV:  No-   chest pain, orthopnea, PND, +swelling in lower extremities, anasarca,                                  dizziness, palpitations Resp: No-   shortness of breath with exertion or at rest.             + productive cough,  No non-productive cough,  No- coughing up of blood.              No-   change in color of mucus.  No- wheezing.   Skin: No-   rash or lesions. GI:  No-   heartburn, indigestion, abdominal pain, nausea, vomiting, GU:  MS:  +joint pain or swelling.   Neuro-     nothing unusual Psych:  No- change in mood or affect. No depression or anxiety.  No memory loss.  OBJ- Physical Exam General- Alert, Oriented, Affect-appropriate, Distress- none acute Skin- rash-none, lesions- none, excoriation- none Lymphadenopathy- none Head- atraumatic  Eyes- Gross vision intact, PERRLA, conjunctivae and secretions clear            Ears- Hearing, canals-normal            Nose- Clear, no-Septal dev, mucus, polyps, erosion, perforation             Throat- Mallampati II-III , mucosa clear , drainage- none, tonsils- atrophic, +Partial plate,                             +active gag Neck- flexible , trachea midline, no stridor , thyroid nl, carotid no bruit Chest - symmetrical excursion ,  unlabored           Heart/CV- RRR , no murmur , no gallop  , no rub, nl s1 s2                           - JVD- none , edema- none, stasis changes- none, varices- none           Lung- clear to P&A, wheeze- none, cough- none , dullness-none, rub- none           Chest wall- + mild kyphosis Abd- is Br/ Gen/ Rectal- Not done, not indicated Extrem- cyanosis- none, clubbing, none, atrophy- none, strength- nl Neuro- grossly intact to observation

## 2014-06-30 ENCOUNTER — Telehealth: Payer: Self-pay | Admitting: Internal Medicine

## 2014-06-30 NOTE — Telephone Encounter (Signed)
Ok to stop [place] and see how she does without it

## 2014-06-30 NOTE — Telephone Encounter (Signed)
Per 06/15/14 OV: Patient Instructions      Script gabapentin to try for control of cough Sample Dulera 100 inhaler with instruction   2 puffs, then rinse mouth, twice daily Continue omeprazole for now and try to follow the Speech Therapist's advice  ---  Called spoke with pt. She was letting us know the gabapentin has helped her cough. She reports she does not like the inhaler. Does not seem to be doing well with it. Leaves nasty taste in mouth even after rinsing out.  FYI for Dr. Maple HudsonYoung

## 2014-06-30 NOTE — Telephone Encounter (Signed)
Spoke with pt's husband and informed of Dr Roxy CedarYoung's recommendations for pt.

## 2014-07-31 ENCOUNTER — Ambulatory Visit (INDEPENDENT_AMBULATORY_CARE_PROVIDER_SITE_OTHER): Payer: Managed Care, Other (non HMO)

## 2014-07-31 VITALS — BP 128/73 | HR 67 | Resp 16

## 2014-07-31 DIAGNOSIS — M2180 Other specified acquired deformities of unspecified limb: Secondary | ICD-10-CM

## 2014-07-31 DIAGNOSIS — M2010 Hallux valgus (acquired), unspecified foot: Secondary | ICD-10-CM

## 2014-07-31 DIAGNOSIS — Q828 Other specified congenital malformations of skin: Secondary | ICD-10-CM

## 2014-07-31 NOTE — Progress Notes (Signed)
   Subjective:    Patient ID: Michaela Wood, female    DOB: Sep 08, 1956, 58 y.o.   MRN: [id number]  HPI Comments: "I am having trouble trimming my toenails and I've got a callus"  Patient states that she is unable to get down to cut her toenails properly. Also, she has a callused area medial 1st toe right that has thickened that sometimes it hurts with shoes and when she walks. She would like to get her toenails cut and the callused trimmed.     Review of Systems  Musculoskeletal: Positive for gait problem.  All other systems reviewed and are negative.      Objective:   Physical Exam 58 year old white female well-developed well-nourished oriented x3 presents at this time vital signs stable no significant distress has a painful callus hallux IP joint right also some dystrophy of nails hallux nails bilateral. Masker status is intact pedal pulses are palpable DP and PT +2/4 bilateral capillary refill time 3 seconds epicritic and proprioceptive sensations intact and symmetric bilateral. Normal plantar response and DTRs noted. 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 Nail care suggest avoiding female postural possible keep nails trimmed straight as instructed if there  is any worsening or changes nails may be candidate for treatment of fungal nails were to develop otherwise discharged from our care to an as-needed basis at this time next progress Alvan Dame DPM

## 2014-07-31 NOTE — Patient Instructions (Signed)
Corns and Calluses Corns are small areas of thickened skin that usually occur on the top, sides, or tip of a toe. They contain a cone-shaped core with a point that can press on a nerve below. This causes pain. Calluses are areas of thickened skin that usually develop on hands, fingers, palms, soles of the feet, and heels. These are areas that experience frequent friction or pressure. CAUSES  Corns are usually the result of rubbing (friction) or pressure from shoes that are too tight or do not fit properly. Calluses are caused by repeated friction and pressure on the affected areas. SYMPTOMS  A hard growth on the skin.  Pain or tenderness under the skin.  Sometimes, redness and swelling.  Increased discomfort while wearing tight-fitting shoes. DIAGNOSIS  Your caregiver can usually tell what the problem is by doing a physical exam. TREATMENT  Removing the cause of the friction or pressure is usually the only treatment needed. However, sometimes medicines can be used to help soften the hardened, thickened areas. These medicines include salicylic acid plasters and 12% ammonium lactate lotion. These medicines should only be used under the direction of your caregiver. HOME CARE INSTRUCTIONS   Try to remove pressure from the affected area.  You may wear donut-shaped corn pads to protect your skin.  You may use a pumice stone or nonmetallic nail file to gently reduce the thickness of a corn.  Wear properly fitted footwear.  If you have calluses on the hands, wear gloves during activities that cause friction.  If you have diabetes, you should regularly examine your feet. Tell your caregiver if you notice any problems with your feet. SEEK IMMEDIATE MEDICAL CARE IF:   You have increased pain, swelling, redness, or warmth in the affected area.  Your corn or callus starts to drain fluid or bleeds.  You are not getting better, even with treatment. Document Released: 08/26/2004 Document  Revised: 02/12/2012 Document Reviewed: 07/18/2011 ExitCare Patient Information 2015 [employer]. This information is not intended to replace advice given to you by your health care provider. Make sure you discuss any questions you have with your health care provider.  

## 2014-09-03 ENCOUNTER — Other Ambulatory Visit: Payer: Self-pay

## 2014-09-03 DIAGNOSIS — Z1239 Encounter for other screening for malignant neoplasm of breast: Secondary | ICD-10-CM

## 2014-09-20 NOTE — Assessment & Plan Note (Signed)
Irritable upper airway cough pattern without significant LPR  or reflux, without significant control with bronchodilator, normal PFT, no change after sustained withdrawal from lisinopril. Plan-continue available measures-omeprazole, gabapentin, Michaela Wood

## 2014-10-19 ENCOUNTER — Ambulatory Visit (INDEPENDENT_AMBULATORY_CARE_PROVIDER_SITE_OTHER): Payer: Managed Care, Other (non HMO) | Admitting: Internal Medicine

## 2014-10-19 ENCOUNTER — Encounter: Payer: Self-pay | Admitting: Internal Medicine

## 2014-10-19 VITALS — BP 114/76 | HR 74 | Ht 61.0 in | Wt 106.4 lb

## 2014-10-19 DIAGNOSIS — R059 Cough, unspecified: Secondary | ICD-10-CM

## 2014-10-19 DIAGNOSIS — R05 Cough: Secondary | ICD-10-CM

## 2014-10-19 DIAGNOSIS — K219 Gastro-esophageal reflux disease without esophagitis: Secondary | ICD-10-CM

## 2014-10-19 MED ORDER — ESOMEPRAZOLE MAGNESIUM 40 MG PO PACK: 40.0000 mg | PACK | Freq: Every day | ORAL | Status: AC

## 2014-10-19 NOTE — Patient Instructions (Signed)
Script and card for Nexium  Ok to use an otc cough syrup like Delsym if needed

## 2014-10-19 NOTE — Progress Notes (Signed)
05/05/14- [age] yoFnever smoker Dr Elkins-chronic cough-happens with eating as well. Has been on Lisinopril in the past-was taken off and cough still happening. Husband Harvie HeckRandy here.  They say she has had a dry cough little changed over 40 years. Treated with cough drops and Robitussin. Denies wheeze, dyspnea. No hx asthma. Little aware of postnasal drip but some seasonal rhinitis. Little GERD but husband says he and family notice she coughs more with meals. 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 No change when she was taken off Lisinopril over a month ago. Not worse lying down at night and not related to season or location.  Environment- house, no basement, no mold/ dampness, no pets or smokers. CXR 03/23/14- FINDINGS:  The cardiomediastinal silhouette is within normal limits. Lungs are  normally to slightly hypoinflated, unchanged. Minimal prominence of  the pulmonary interstitial markings is unchanged. There is no  evidence of airspace consolidation, edema, pleural effusion, or  pneumothorax. Mild multilevel thoracic spondylosis is again noted.  IMPRESSION:  No active cardiopulmonary disease.  Electronically Signed  By: Sebastian AcheAllen Grady  On: 03/23/2014 08:53   06/15/14- 58 yoFnever smoker Dr Elkins-chronic cough-happens with eating as well. Has been on Lisinopril in the past-was taken off and cough still happening. FOLLOWS FOR:  Still having cough, non-productive.  Reports not much improvement since last ov. She reduced her caffeine and is taking omeprazole. PFT: 05/18/2014-within normal limits, FEV1/FVC 0.89, TLC 109%, DLCO 86%. Modified Ba Swallow SLP 05/18/14 Dysphagia Diagnosis: Within Functional Limits  Clinical impression: Pt presents with functional oropharyngeal swallow ability with timely and strong swallow and adequate airway protection. Minimally prominent cricopharyngeus noted with appearance of minimally transient delayed  clearance below UES. Pt did not cough prior to or during MBS, subtle cough x1 noted after completion of test immediately after pt took a drink of water.  Reflux symptom index screen completed with pt scoring 14 - possibly indicating LPR. Provided pt with compensation strategies and RSI screen to retest in one month for functional improvement. Thanks for this referral.  Treatment Recommendation  n/a  Diet Recommendation Regular;Thin liquid   10/19/14- 58 yoFnever smoker Dr Elkins-chronic cough-happens with eating as well. Has been on Lisinopril in the past-was taken off and cough still happening FOLLOWS FOR: Continues to cough but not as bad-non prodcutive mainly Aware of occasional reflux. Off lisinopril Modified Barium Swallow- 05/18/14- minor esophageal dysfunction, regular diet recommended. PFT-05/18/2014- WNL  ROS-see HPI Constitutional:   No-   weight loss, night sweats, fevers, chills, fatigue, lassitude. HEENT:   No-  headaches, difficulty swallowing, tooth/dental problems, sore throat,    no- sneezing, no-itching, ear ache, nasal congestion, post nasal drip,  CV:  No-   chest pain, orthopnea, PND, +swelling in lower extremities, anasarca,                                  dizziness, palpitations Resp: No-   shortness of breath with exertion or at rest.             No- productive cough,  + non-productive cough,  No- coughing up of blood.              No-   change in color of mucus.  No- wheezing.   Skin: No-   rash or lesions. GI:  No-   heartburn, indigestion, abdominal pain, nausea, vomiting, GU:  MS:  +  joint pain or swelling.   Neuro-     nothing unusual Psych:  No- change in mood or affect. No depression or anxiety.  No memory loss.  OBJ- Physical Exam General- Alert, Oriented, Affect-appropriate, Distress- none acute Skin- rash-none, lesions- none, excoriation- none Lymphadenopathy- none Head- atraumatic            Eyes- Gross vision intact, PERRLA, conjunctivae and  secretions clear            Ears- Hearing, canals-normal            Nose- Clear, no-Septal dev, mucus, polyps, erosion, perforation             Throat- Mallampati II-III , mucosa clear , drainage- none, tonsils- atrophic,                        +Partial plate, +active gag Neck- flexible , trachea midline, no stridor , thyroid nl, carotid no bruit Chest - symmetrical excursion , unlabored           Heart/CV- RRR , no murmur , no gallop  , no rub, nl s1 s2                           - JVD- none , edema- none, stasis changes- none, varices- none           Lung- clear to P&A, wheeze- none, cough- none , dullness-none, rub- none           Chest wall- + mild kyphosis Abd-  Br/ Gen/ Rectal- Not done, not indicated Extrem- cyanosis- none, clubbing, none, atrophy- none, strength- nl Neuro- grossly intact to observation

## 2014-11-01 DIAGNOSIS — K219 Gastro-esophageal reflux disease without esophagitis: Secondary | ICD-10-CM | POA: Insufficient documentation

## 2014-11-01 NOTE — Assessment & Plan Note (Signed)
Not clear how significant this problem is or whether it is related to her complaint of cough. Plan-Nexium with observation

## 2014-11-01 NOTE — Assessment & Plan Note (Signed)
Irritable upper airway cough pattern without significant LPR  or reflux, without significant control with bronchodilator, normal PFT, no change after sustained withdrawal from lisinopril. Cough is improved Plan-continue treating for GERD and watch over time

## 2014-11-02 ENCOUNTER — Ambulatory Visit
Admission: RE | Admit: 2014-11-02 | Discharge: 2014-11-02 | Disposition: A | Discharge status: Home / Self Care | Payer: Managed Care, Other (non HMO) | Source: Ambulatory Visit

## 2014-11-02 ENCOUNTER — Other Ambulatory Visit: Payer: Self-pay

## 2014-11-02 DIAGNOSIS — Z1231 Encounter for screening mammogram for malignant neoplasm of breast: Secondary | ICD-10-CM

## 2014-12-17 IMAGING — CR DG CHEST 2V
2 series · 2 of 2 positions shown · non-contrast
Comparison: 11/05/2013

CLINICAL DATA: Cough.

EXAM:
CHEST  2 VIEW

[w chest pa]
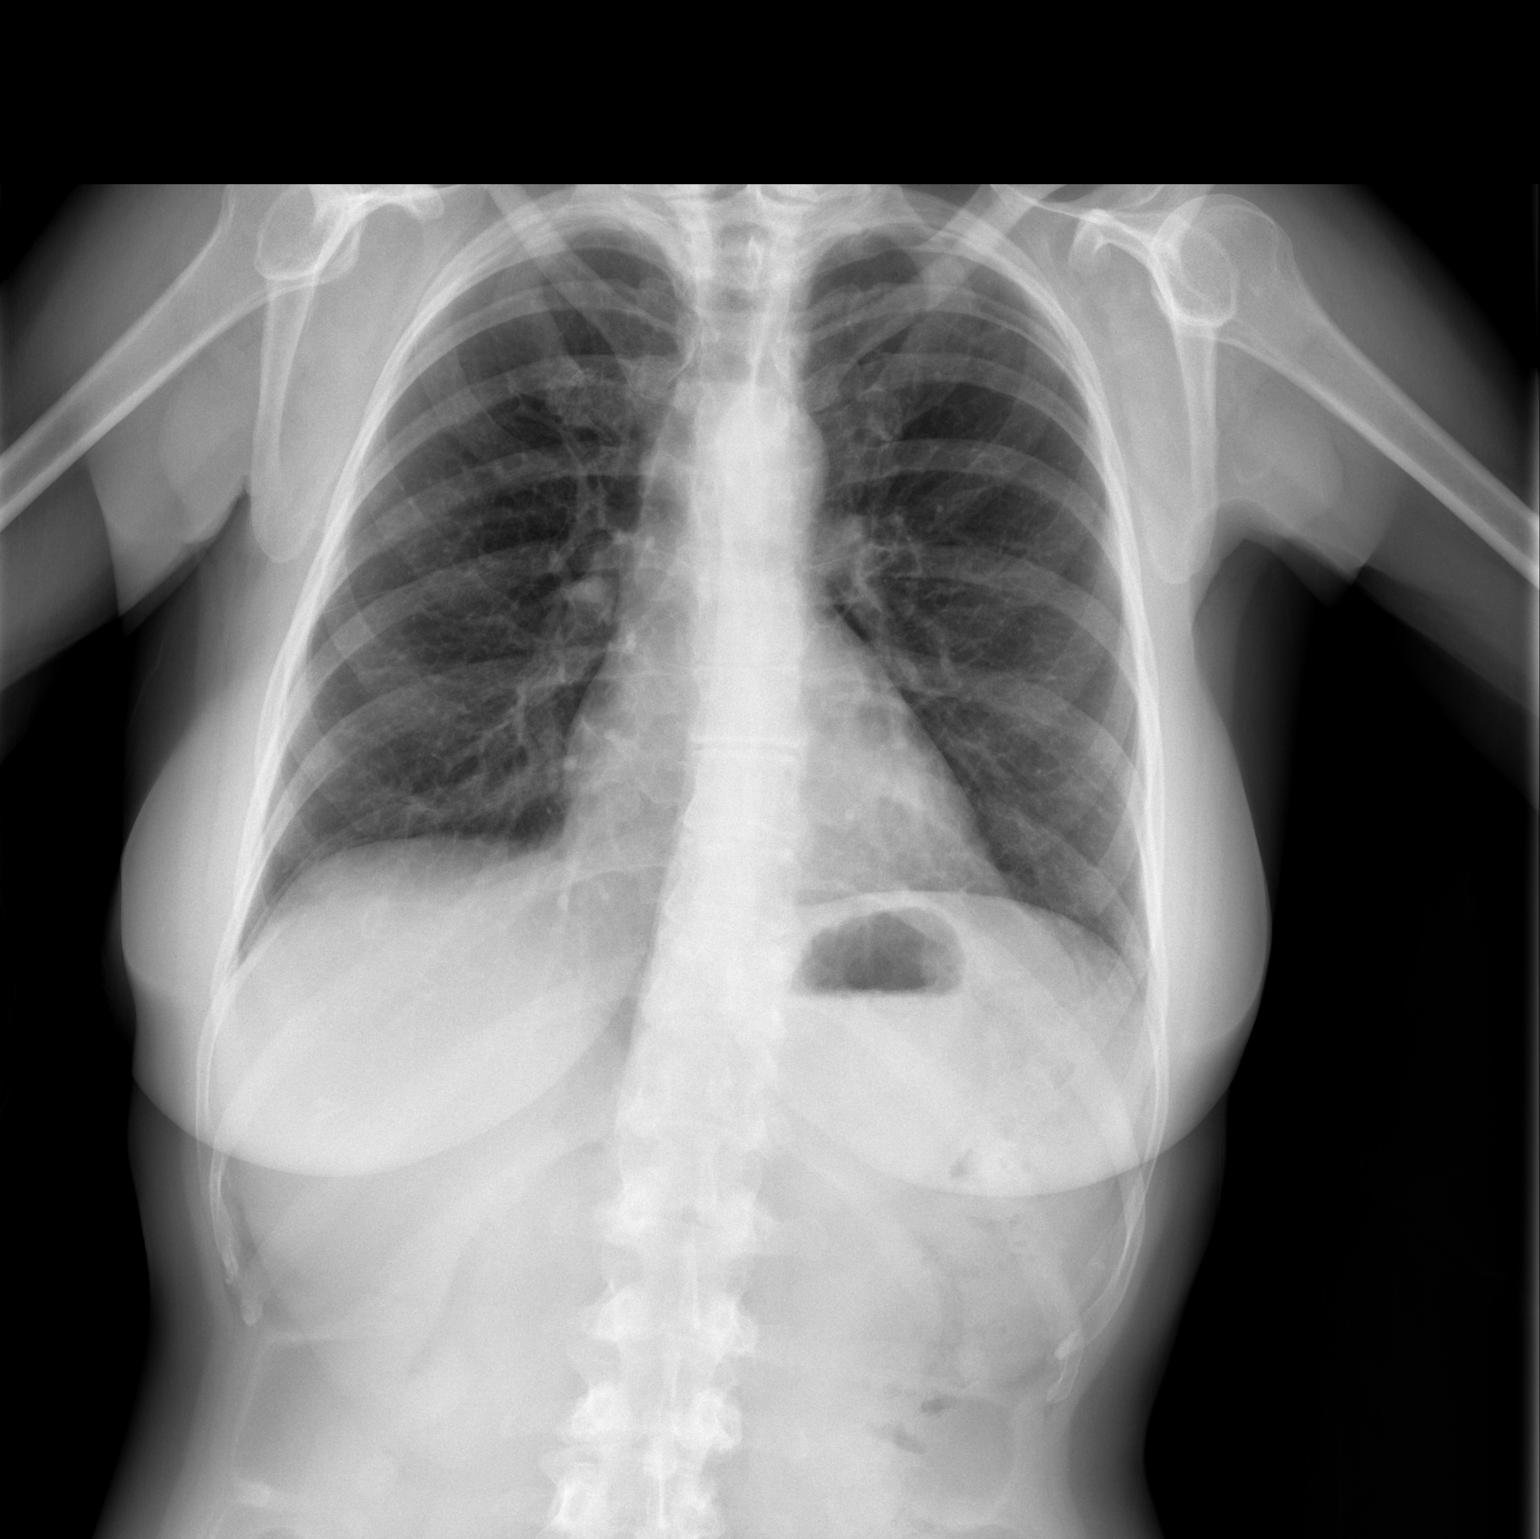

[w chest lat]
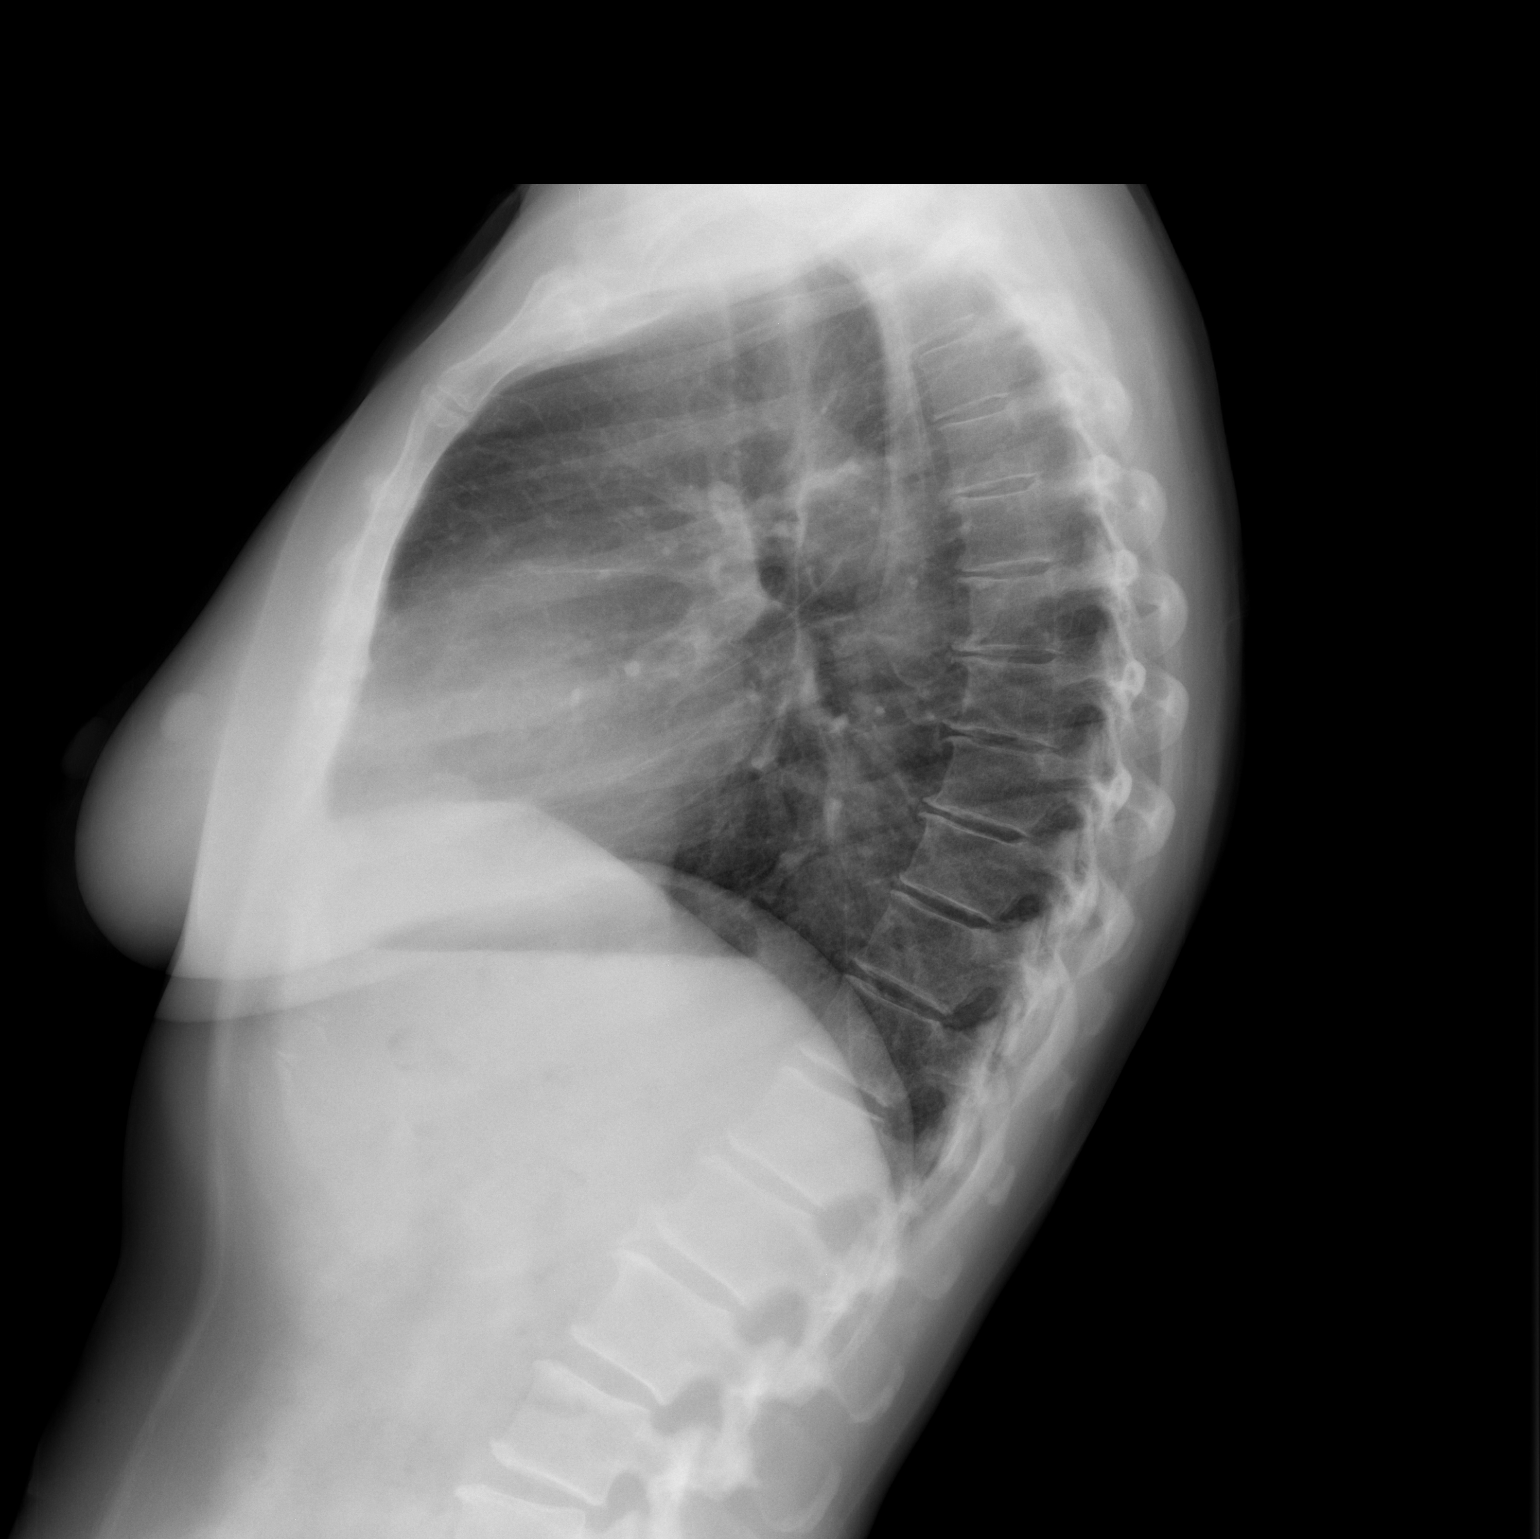

[2 of 2 positions shown; findings below may reference images not displayed]

FINDINGS: The cardiomediastinal silhouette is within normal limits. Lungs are
normally to slightly hypoinflated, unchanged. Minimal prominence of
the pulmonary interstitial markings is unchanged. There is no
evidence of airspace consolidation, edema, pleural effusion, or
pneumothorax. Mild multilevel thoracic spondylosis is again noted.
IMPRESSION: No active cardiopulmonary disease.

## 2015-08-17 ENCOUNTER — Ambulatory Visit: Payer: Self-pay | Admitting: Orthopedic Surgery

## 2015-08-17 NOTE — Progress Notes (Signed)
Preoperative surgical orders have been place into the Epic hospital system for Michaela Wood on 08/17/2015, 4:53 PM  by Patrica Duel for surgery on 09-20-2015.  Preop Total Knee orders including Experal, IV Tylenol, and IV Decadron as long as there are no contraindications to the above medications. Avel Peace, PA-C

## 2015-09-01 NOTE — H&P (Signed)
TOTAL KNEE ADMISSION H&P  Patient is being admitted for right total knee arthroplasty.  Subjective:  Chief Complaint:right knee pain.  HPI: Michaela Wood, 59 y.o. female, has a history of pain and functional disability in the right knee due to arthritis and has failed non-surgical conservative treatments for greater than 12 weeks to includeNSAID's and/or analgesics, corticosteriod injections and activity modification.  Onset of symptoms was gradual, starting 5 years ago with gradually worsening course since that time. The patient noted prior procedures on the knee to include  arthroscopy and menisectomy on the right knee(s).  Patient currently rates pain in the right knee(s) at 7 out of 10 with activity. Patient has night pain, worsening of pain with activity and weight bearing, pain that interferes with activities of daily living, pain with passive range of motion, crepitus and joint swelling.  Patient has evidence of periarticular osteophytes and joint space narrowing by imaging studies.  There is no active infection.  Patient Active Problem List   Diagnosis Date Noted  . GERD (gastroesophageal reflux disease) 11/01/2014  . Cough 05/05/2014  . Lateral meniscal tear 11/05/2013   Past Medical History  Diagnosis Date  . PONV (postoperative nausea and vomiting)     only with wisdom teeth extraction  . Hypertension   . Abnormal EKG     "told bundle branch blockage"-EKG brought in 09-11-07 copy with chart  . Varicose veins 11-04-13    oral varicose veins-surgically excised  . Arthritis   . Bruises easily     hx. of. Fell" knee gave way" Right cheek with recent suture removal  . Varicose veins 11-04-13    bilateral legs-no problems    Past Surgical History  Procedure Laterality Date  . Colonoscopy    . Wisdom tooth extraction    . Childbirth      x2 NVD  . Knee arthroscopy Right 11/05/2013    Procedure: RIGHT ARTHROSCOPY KNEE [facility] MENISCUS  DEBRIDEMENT;  Surgeon: Loanne Drilling,  MD;  Location: [facility];  Service: Orthopedics;  Laterality: Right;     Current outpatient prescriptions:  .  benzonatate (TESSALON) 200 MG capsule, Take 1 capsule (200 mg total) by mouth 3 (three) times daily as needed for cough., Disp: 30 capsule, Rfl: 1 .  Calcium Carbonate-Vitamin D (CALCIUM 500 + D PO), Take by mouth daily., Disp: , Rfl:  .  esomeprazole (NEXIUM) 40 MG packet, Take 40 mg by mouth daily before breakfast., Disp: 30 each, Rfl: 12 .  Estradiol-Norethindrone Acet (ACTIVELLA) 0.5-0.1 MG per tablet, Take 1 tablet by mouth daily., Disp: , Rfl:  .  etodolac (LODINE) 400 MG tablet, Take 400 mg by mouth 2 (two) times daily., Disp: , Rfl:  .  gabapentin (NEURONTIN) 100 MG capsule, Take 1 capsule (100 mg total) by mouth 3 (three) times daily., Disp: 30 capsule, Rfl: 1 .  hydrochlorothiazide (HYDRODIURIL) 50 MG tablet, Take 1 tablet by mouth daily., Disp: , Rfl:  .  mometasone-formoterol (DULERA) 100-5 MCG/ACT AERO, Inhale 2 puffs into the lungs 2 (two) times daily., Disp: 1 Inhaler, Rfl: 0 .  Multiple Vitamin (MULTIVITAMIN) tablet, Take 1 tablet by mouth daily., Disp: , Rfl:  .  multivitamin-lutein (OCUVITE-LUTEIN) CAPS capsule, Take 1 capsule by mouth daily., Disp: , Rfl:  .  omeprazole (PRILOSEC) 40 MG capsule, Take 40 mg by mouth daily., Disp: , Rfl:   Allergies  Allergen Reactions  . Codeine Swelling and Rash    Social History  Substance Use Topics  . Smoking status:  Never Smoker   . Smokeless tobacco: Not on file  . Alcohol Use: No    Family History  Problem Relation Age of Onset  . Heart disease Mother     left bundle block  . Diabetes Father   . Heart disease Son      Review of Systems  Constitutional: Positive for diaphoresis. Negative for fever, chills, weight loss and malaise/fatigue.  HENT: Negative.   Eyes: Negative.   Respiratory: Positive for cough. Negative for hemoptysis, sputum production, shortness of breath and wheezing.   Cardiovascular: Negative.    Gastrointestinal: Negative.   Genitourinary: Negative.   Musculoskeletal: Positive for joint pain. Negative for myalgias, back pain, falls and neck pain.       Right knee pain  Skin: Negative.   Neurological: Negative.  Negative for weakness.  Endo/Heme/Allergies: Negative.   Psychiatric/Behavioral: Negative.     Objective:  Physical Exam  Constitutional: She is oriented to person, place, and time. She appears well-developed and well-nourished. No distress.  HENT:  Head: Normocephalic and atraumatic.  Right Ear: External ear normal.  Left Ear: External ear normal.  Nose: Nose normal.  Mouth/Throat: Oropharynx is clear and moist.  Eyes: Conjunctivae and EOM are normal.  Neck: Normal range of motion. Neck supple.  Cardiovascular: Normal rate, regular rhythm, normal heart sounds and intact distal pulses.   No murmur heard. Respiratory: Effort normal and breath sounds normal. No respiratory distress. She has no wheezes.  GI: Soft. Bowel sounds are normal. She exhibits no distension. There is no tenderness.  Musculoskeletal:       Right hip: Normal.       Left hip: Normal.       Right knee: She exhibits decreased range of motion and swelling. She exhibits no effusion and no erythema. Tenderness found. Medial joint line and lateral joint line tenderness noted.       Left knee: Normal.  Her right hip shows normal range of motion. No discomfort. The right knee shows no effusion. Range of motion is about 5 to 120 with marked crepitus on range of motion. Tenderness medial greater than lateral with no instability noted.  Neurological: She is alert and oriented to person, place, and time. She has normal strength. No sensory deficit.  Skin: No rash noted. She is not diaphoretic. No erythema.  Psychiatric: She has a normal mood and affect. Her behavior is normal.    Vitals  Weight: 103 lb Height: 61in Body Surface Area: 1.42 m Body Mass Index: 19.46 kg/m  Pulse: 80 (Regular)   BP: 138/84 (Sitting, Left Arm, Standard)  Imaging Review Plain radiographs demonstrate severe degenerative joint disease of the right knee(s). The overall alignment ismild valgus. The bone quality appears to be good for age and reported activity level.  Assessment/Plan:  End stage primary osteoarthritis, right knee   The patient history, physical examination, clinical judgment of the provider and imaging studies are consistent with end stage degenerative joint disease of the right knee(s) and total knee arthroplasty is deemed medically necessary. The treatment options including medical management, injection therapy arthroscopy and arthroplasty were discussed at length. The risks and benefits of total knee arthroplasty were presented and reviewed. The risks due to aseptic loosening, infection, stiffness, patella tracking problems, thromboembolic complications and other imponderables were discussed. The patient acknowledged the explanation, agreed to proceed with the plan and consent was signed. Patient is being admitted for inpatient treatment for surgery, pain control, PT, OT, prophylactic antibiotics, VTE prophylaxis, progressive ambulation and  ADL's and discharge planning. The patient is planning to be discharged home with home health services   PCP: Dr. Windle Guard TXA IV    Dimitri Ped, PA-C

## 2015-09-14 NOTE — Patient Instructions (Addendum)
20 Michaela Wood  09/14/2015   Your procedure is scheduled on:   09-20-2015 Monday  Enter through [facility]  Entrance and follow signs to [facility]. Arrive at      0630  AM.  (Limit 1 person with you).  Call this number if you have problems the morning of surgery: [phone]  Or Presurgical Testing [phone].   For Living Will and/or Health Care Power Attorney Forms: please provide copy for your medical record,may bring AM of surgery(Forms should be already notarized -we do not provide this service).(09-15-15 Yes, will bring AM of surgery).      Do not eat food/ or drink: After Midnight.      Take these medicines the morning of surgery with A SIP OF WATER-   (DO NOT TAKE ANY DIABETIC MEDS AM OF SURGERY) : Provera. Prilosec.   Do not wear jewelry, make-up or nail polish.  Do not wear deodorant, lotions, powders, or perfumes.   Do not shave legs and under arms- 48 hours(2 days) prior to first CHG shower.(Shaving face and neck okay.)  Do not bring valuables to the hospital.(Hospital is not responsible for lost valuables).  Contacts, dentures or removable bridgework, body piercing, hair pins may not be worn into surgery.  Leave suitcase in the car. After surgery it may be brought to your room.  For patients admitted to the hospital, checkout time is 11:00 AM the day of discharge.(Restricted visitors-Any Persons displaying flu-like symptoms or illness).    Patients discharged the day of surgery will not be allowed to drive home. Must have responsible person with you x 24 hours once discharged.  Name and phone number of your driver: Michaela Wood [phone] cell     Please read over the following fact sheets that you were given:  CHG(Chlorhexidine Gluconate 4% Surgical Soap) use, MRSA Information, Blood Transfusion fact sheet, Incentive Spirometry Instruction.  Remember : Type/Screen "Blue armbands" - may not be removed once applied(would result in being  retested AM of surgery, if removed).         [place] - Preparing for Surgery Before surgery, you can play an important role.  Because skin is not sterile, your skin needs to be as free of germs as possible.  You can reduce the number of germs on your skin by washing with CHG (chlorahexidine gluconate) soap before surgery.  CHG is an antiseptic cleaner which kills germs and bonds with the skin to continue killing germs even after washing. Please DO NOT use if you have an allergy to CHG or antibacterial soaps.  If your skin becomes reddened/irritated stop using the CHG and inform your nurse when you arrive at Short Stay. Do not shave (including legs and underarms) for at least 48 hours prior to the first CHG shower.  You may shave your face/neck. Please follow these instructions carefully:  1.  Shower with CHG Soap the night before surgery and the  morning of Surgery.  2.  If you choose to wash your hair, wash your hair first as usual with your  normal  shampoo.  3.  After you shampoo, rinse your hair and body thoroughly to remove the  shampoo.                           4.  Use CHG as you would any other liquid soap.  You can apply chg directly  to the skin and wash  Gently with a scrungie or clean washcloth.  5.  Apply the CHG Soap to your body ONLY FROM THE NECK DOWN.   Do not use on face/ open                           Wound or open sores. Avoid contact with eyes, ears mouth and genitals (private parts).                       Wash face,  Genitals (private parts) with your normal soap.             6.  Wash thoroughly, paying special attention to the area where your surgery  will be performed.  7.  Thoroughly rinse your body with warm water from the neck down.  8.  DO NOT shower/wash with your normal soap after using and rinsing off  the CHG Soap.                9.  Pat yourself dry with a clean towel.            10.  Wear clean pajamas.            11.  Place clean  sheets on your bed the night of your first shower and do not  sleep with pets. Day of Surgery : Do not apply any lotions/deodorants the morning of surgery.  Please wear clean clothes to the hospital/surgery center.  FAILURE TO FOLLOW THESE INSTRUCTIONS MAY RESULT IN THE CANCELLATION OF YOUR SURGERY PATIENT SIGNATURE_________________________________  NURSE SIGNATURE__________________________________  ________________________________________________________________________   Adam Phenix  An incentive spirometer is a tool that can help keep your lungs clear and active. This tool measures how well you are filling your lungs with each breath. Taking long deep breaths may help reverse or decrease the chance of developing breathing (pulmonary) problems (especially infection) following:  A long period of time when you are unable to move or be active. BEFORE THE PROCEDURE   If the spirometer includes an indicator to show your best effort, your nurse or respiratory therapist will set it to a desired goal.  If possible, sit up straight or lean slightly forward. Try not to slouch.  Hold the incentive spirometer in an upright position. INSTRUCTIONS FOR USE   Sit on the edge of your bed if possible, or sit up as far as you can in bed or on a chair.  Hold the incentive spirometer in an upright position.  Breathe out normally.  Place the mouthpiece in your mouth and seal your lips tightly around it.  Breathe in slowly and as deeply as possible, raising the piston or the ball toward the top of the column.  Hold your breath for 3-5 seconds or for as long as possible. Allow the piston or ball to fall to the bottom of the column.  Remove the mouthpiece from your mouth and breathe out normally.  Rest for a few seconds and repeat Steps 1 through 7 at least 10 times every 1-2 hours when you are awake. Take your time and take a few normal breaths between deep breaths.  The spirometer may  include an indicator to show your best effort. Use the indicator as a goal to work toward during each repetition.  After each set of 10 deep breaths, practice coughing to be sure your lungs are clear. If you have an incision (the cut made at the time of  surgery), support your incision when coughing by placing a pillow or rolled up towels firmly against it. Once you are able to get out of bed, walk around indoors and cough well. You may stop using the incentive spirometer when instructed by your caregiver.  RISKS AND COMPLICATIONS  Take your time so you do not get dizzy or light-headed.  If you are in pain, you may need to take or ask for pain medication before doing incentive spirometry. It is harder to take a deep breath if you are having pain. AFTER USE  Rest and breathe slowly and easily.  It can be helpful to keep track of a log of your progress. Your caregiver can provide you with a simple table to help with this. If you are using the spirometer at home, follow these instructions: [place] IF:   You are having difficultly using the spirometer.  You have trouble using the spirometer as often as instructed.  Your pain medication is not giving enough relief while using the spirometer.  You develop fever of 100.5 F (38.1 C) or higher. SEEK IMMEDIATE MEDICAL CARE IF:   You cough up bloody sputum that had not been present before.  You develop fever of 102 F (38.9 C) or greater.  You develop worsening pain at or near the incision site. MAKE SURE YOU:   Understand these instructions.  Will watch your condition.  Will get help right away if you are not doing well or get worse. Document Released: 04/02/2007 Document Revised: 02/12/2012 Document Reviewed: 06/03/2007 Exit[employer] Patient Information 2014 [employer], [place].   ________________________________________________________________________  WHAT IS A BLOOD TRANSFUSION? Blood Transfusion Information  A transfusion  is the replacement of blood or some of its parts. Blood is made up of multiple cells which provide different functions.  Red blood cells carry oxygen and are used for blood loss replacement.  White blood cells fight against infection.  Platelets control bleeding.  Plasma helps clot blood.  Other blood products are available for specialized needs, such as hemophilia or other clotting disorders. BEFORE THE TRANSFUSION  Who gives blood for transfusions?   Healthy volunteers who are fully evaluated to make sure their blood is safe. This is blood bank blood. Transfusion therapy is the safest it has ever been in the practice of medicine. Before blood is taken from a donor, a complete history is taken to make sure that person has no history of diseases nor engages in risky social behavior (examples are intravenous drug use or sexual activity with multiple partners). The donor's travel history is screened to minimize risk of transmitting infections, such as malaria. The donated blood is tested for signs of infectious diseases, such as HIV and hepatitis. The blood is then tested to be sure it is compatible with you in order to minimize the chance of a transfusion reaction. If you or a relative donates blood, this is often done in anticipation of surgery and is not appropriate for emergency situations. It takes many days to process the donated blood. RISKS AND COMPLICATIONS Although transfusion therapy is very safe and saves many lives, the main dangers of transfusion include:   Getting an infectious disease.  Developing a transfusion reaction. This is an allergic reaction to something in the blood you were given. Every precaution is taken to prevent this. The decision to have a blood transfusion has been considered carefully by your caregiver before blood is given. Blood is not given unless the benefits outweigh the risks. AFTER THE TRANSFUSION  Right after receiving a blood transfusion, you will  usually feel much better and more energetic. This is especially true if your red blood cells have gotten low (anemic). The transfusion raises the level of the red blood cells which carry oxygen, and this usually causes an energy increase.  The nurse administering the transfusion will monitor you carefully for complications. HOME CARE INSTRUCTIONS  No special instructions are needed after a transfusion. You may find your energy is better. Speak with your caregiver about any limitations on activity for underlying diseases you may have. SEEK MEDICAL CARE IF:   Your condition is not improving after your transfusion.  You develop redness or irritation at the intravenous (IV) site. SEEK IMMEDIATE MEDICAL CARE IF:  Any of the following symptoms occur over the next 12 hours:  Shaking chills.  You have a temperature by mouth above 102 F (38.9 C), not controlled by medicine.  Chest, back, or muscle pain.  People around you feel you are not acting correctly or are confused.  Shortness of breath or difficulty breathing.  Dizziness and fainting.  You get a rash or develop hives.  You have a decrease in urine output.  Your urine turns a dark color or changes to pink, red, or brown. Any of the following symptoms occur over the next 10 days:  You have a temperature by mouth above 102 F (38.9 C), not controlled by medicine.  Shortness of breath.  Weakness after normal activity.  The white part of the eye turns yellow (jaundice).  You have a decrease in the amount of urine or are urinating less often.  Your urine turns a dark color or changes to pink, red, or brown. Document Released: 11/17/2000 Document Revised: 02/12/2012 Document Reviewed: 07/06/2008 [facility] Patient Information 2014 [place], [place].  _______________________________________________________________________

## 2015-09-15 ENCOUNTER — Encounter (HOSPITAL_COMMUNITY)
Admission: RE | Admit: 2015-09-15 | Discharge: 2015-09-15 | Disposition: A | Discharge status: Home / Self Care | Payer: 59 | Source: Ambulatory Visit | Attending: Orthopedic Surgery | Admitting: Orthopedic Surgery

## 2015-09-15 ENCOUNTER — Encounter (HOSPITAL_COMMUNITY): Payer: Self-pay

## 2015-09-15 DIAGNOSIS — Z01818 Encounter for other preprocedural examination: Secondary | ICD-10-CM | POA: Insufficient documentation

## 2015-09-15 DIAGNOSIS — M179 Osteoarthritis of knee, unspecified: Secondary | ICD-10-CM | POA: Insufficient documentation

## 2015-09-15 HISTORY — DX: Type 2 diabetes mellitus without complications: E11.9

## 2015-09-15 HISTORY — DX: Gastro-esophageal reflux disease without esophagitis: K21.9

## 2015-09-15 LAB — COMPREHENSIVE METABOLIC PANEL
ALT: 19 U/L (ref 14–54)
AST: 19 U/L (ref 15–41)
Albumin: 4.4 g/dL (ref 3.5–5.0)
Alkaline Phosphatase: 63 U/L (ref 38–126)
Anion gap: 11 (ref 5–15)
BILIRUBIN TOTAL: 0.6 mg/dL (ref 0.3–1.2)
BUN: 15 mg/dL (ref 6–20)
CHLORIDE: 99 mmol/L — AB (ref 101–111)
CO2: 30 mmol/L (ref 22–32)
CREATININE: 0.65 mg/dL (ref 0.44–1.00)
Calcium: 9.5 mg/dL (ref 8.9–10.3)
Glucose, Bld: 108 mg/dL — ABNORMAL HIGH (ref 65–99)
POTASSIUM: 2.8 mmol/L — AB (ref 3.5–5.1)
Sodium: 140 mmol/L (ref 135–145)
TOTAL PROTEIN: 7.7 g/dL (ref 6.5–8.1)

## 2015-09-15 LAB — CBC
HCT: 40.8 % (ref 36.0–46.0)
Hemoglobin: 13.8 g/dL (ref 12.0–15.0)
MCH: 30.6 pg (ref 26.0–34.0)
MCHC: 33.8 g/dL (ref 30.0–36.0)
MCV: 90.5 fL (ref 78.0–100.0)
PLATELETS: 286 10*3/uL (ref 150–400)
RBC: 4.51 MIL/uL (ref 3.87–5.11)
RDW: 12.7 % (ref 11.5–15.5)
WBC: 6.2 10*3/uL (ref 4.0–10.5)

## 2015-09-15 LAB — URINALYSIS, ROUTINE W REFLEX MICROSCOPIC
Bilirubin Urine: NEGATIVE
GLUCOSE, UA: NEGATIVE mg/dL
Hgb urine dipstick: NEGATIVE
KETONES UR: NEGATIVE mg/dL
LEUKOCYTES UA: NEGATIVE
Nitrite: NEGATIVE
PH: 7.5 (ref 5.0–8.0)
Protein, ur: NEGATIVE mg/dL
SPECIFIC GRAVITY, URINE: 1.007 (ref 1.005–1.030)
Urobilinogen, UA: 0.2 mg/dL (ref 0.0–1.0)

## 2015-09-15 LAB — APTT: aPTT: 29 seconds (ref 24–37)

## 2015-09-15 LAB — PROTIME-INR
INR: 1 (ref 0.00–1.49)
PROTHROMBIN TIME: 13.4 s (ref 11.6–15.2)

## 2015-09-15 LAB — ABO/RH: ABO/RH(D): B POS

## 2015-09-15 LAB — SURGICAL PCR SCREEN
MRSA, PCR: NEGATIVE
Staphylococcus aureus: POSITIVE — AB

## 2015-09-15 NOTE — Progress Notes (Signed)
09-15-15 1115 Potassium 2.8, Positive Staph aureus by PCR- Rx. [facility] [phone]. Labs viewable in [employer].

## 2015-09-15 NOTE — Pre-Procedure Instructions (Addendum)
EKG 09-07-15 -report with chart- Clearance note Dr. Jeannetta NapElkins. 09-15-15 1115 Potassium 2.8 , Positive Staph aureus by PCR- note fax per Epic to [phone]. 09-15-15 1130- Pt. Notified per voice mail to pick up RX from [facility] - use Mupirocin as directed

## 2015-09-15 NOTE — Anesthesia Preprocedure Evaluation (Addendum)
Anesthesia Evaluation  Patient identified by MRN, date of birth, ID band Patient awake    Reviewed: Allergy & Precautions, NPO status , Patient's Chart, lab work & pertinent test results, reviewed documented beta blocker date and time   History of Anesthesia Complications (+) PONV  Airway Mallampati: II   Neck ROM: Full    Dental  (+) Partial Lower, Dental Advisory Given   Pulmonary    breath sounds clear to auscultation       Cardiovascular hypertension, Pt. on medications  Rhythm:Regular     Neuro/Psych    GI/Hepatic GERD  Medicated,  Endo/Other  diabetes, Type 2  Renal/GU      Musculoskeletal   Abdominal (+)  Abdomen: soft.    Peds  Hematology 13/40, plts 286   Anesthesia Other Findings K 2.8 0n 09/15/15, called Aluisio's office, they to address  Reproductive/Obstetrics                            Anesthesia Physical Anesthesia Plan  ASA: II  Anesthesia Plan: Spinal   Post-op Pain Management:    Induction:   Airway Management Planned: Nasal Cannula  Additional Equipment:   Intra-op Plan:   Post-operative Plan:   Informed Consent: I have reviewed the patients History and Physical, chart, labs and discussed the procedure including the risks, benefits and alternatives for the proposed anesthesia with the patient or authorized representative who has indicated his/her understanding and acceptance.     Plan Discussed with:   Anesthesia Plan Comments:         Anesthesia Quick Evaluation

## 2015-09-16 ENCOUNTER — Ambulatory Visit: Payer: Self-pay | Admitting: Orthopedic Surgery

## 2015-09-16 NOTE — Pre-Procedure Instructions (Signed)
09-16-15 1000 Fax note received patient placed on Potassium supplement x4 days prior to surgery.

## 2015-09-16 NOTE — Progress Notes (Signed)
Please repeat BMET on the morning of surgery upon arrival and check in.  A new BMET order was placed today, 09/16/2015, to de drawn on 09/19/16/2016 the day of surgery.   Avel Peacerew Elfa Wooton, PA-C

## 2015-09-20 ENCOUNTER — Encounter (HOSPITAL_COMMUNITY)
Admission: RE | Disposition: A | Discharge status: Home Health | Payer: Self-pay | Source: Ambulatory Visit | Attending: Orthopedic Surgery

## 2015-09-20 ENCOUNTER — Encounter (HOSPITAL_COMMUNITY): Payer: Self-pay | Admitting: *Deleted

## 2015-09-20 ENCOUNTER — Inpatient Hospital Stay (HOSPITAL_COMMUNITY): Payer: 59 | Admitting: Anesthesiology

## 2015-09-20 ENCOUNTER — Inpatient Hospital Stay (HOSPITAL_COMMUNITY)
Admission: RE | Admit: 2015-09-20 | Discharge: 2015-09-22 | DRG: 470 | Disposition: A | Discharge status: Home Health | Payer: 59 | Source: Ambulatory Visit | Attending: Orthopedic Surgery | Admitting: Orthopedic Surgery

## 2015-09-20 DIAGNOSIS — Z79899 Other long term (current) drug therapy: Secondary | ICD-10-CM

## 2015-09-20 DIAGNOSIS — Z833 Family history of diabetes mellitus: Secondary | ICD-10-CM

## 2015-09-20 DIAGNOSIS — T402X5A Adverse effect of other opioids, initial encounter: Secondary | ICD-10-CM | POA: Diagnosis not present

## 2015-09-20 DIAGNOSIS — I10 Essential (primary) hypertension: Secondary | ICD-10-CM | POA: Diagnosis present

## 2015-09-20 DIAGNOSIS — Z01812 Encounter for preprocedural laboratory examination: Secondary | ICD-10-CM

## 2015-09-20 DIAGNOSIS — R110 Nausea: Secondary | ICD-10-CM | POA: Diagnosis not present

## 2015-09-20 DIAGNOSIS — M1711 Unilateral primary osteoarthritis, right knee: Secondary | ICD-10-CM | POA: Diagnosis present

## 2015-09-20 DIAGNOSIS — M25561 Pain in right knee: Secondary | ICD-10-CM | POA: Diagnosis present

## 2015-09-20 DIAGNOSIS — K219 Gastro-esophageal reflux disease without esophagitis: Secondary | ICD-10-CM | POA: Diagnosis present

## 2015-09-20 DIAGNOSIS — E119 Type 2 diabetes mellitus without complications: Secondary | ICD-10-CM | POA: Diagnosis present

## 2015-09-20 DIAGNOSIS — Z8249 Family history of ischemic heart disease and other diseases of the circulatory system: Secondary | ICD-10-CM | POA: Diagnosis not present

## 2015-09-20 DIAGNOSIS — M1710 Unilateral primary osteoarthritis, unspecified knee: Secondary | ICD-10-CM | POA: Diagnosis present

## 2015-09-20 DIAGNOSIS — M179 Osteoarthritis of knee, unspecified: Secondary | ICD-10-CM | POA: Diagnosis present

## 2015-09-20 HISTORY — PX: TOTAL KNEE ARTHROPLASTY: SHX125

## 2015-09-20 LAB — TYPE AND SCREEN
ABO/RH(D): B POS
ANTIBODY SCREEN: NEGATIVE

## 2015-09-20 LAB — BASIC METABOLIC PANEL
ANION GAP: 9 (ref 5–15)
BUN: 16 mg/dL (ref 6–20)
CHLORIDE: 104 mmol/L (ref 101–111)
CO2: 26 mmol/L (ref 22–32)
Calcium: 9.2 mg/dL (ref 8.9–10.3)
Creatinine, Ser: 0.71 mg/dL (ref 0.44–1.00)
GFR calc non Af Amer: >60 mL/min (ref >60)
GLUCOSE: 136 mg/dL — AB (ref 65–99)
POTASSIUM: 3.9 mmol/L (ref 3.5–5.1)
Sodium: 139 mmol/L (ref 135–145)

## 2015-09-20 LAB — GLUCOSE, CAPILLARY: Glucose-Capillary: 115 mg/dL — ABNORMAL HIGH (ref 65–99)

## 2015-09-20 SURGERY — ARTHROPLASTY, KNEE, TOTAL
Anesthesia: Spinal | Site: Knee | Laterality: Right

## 2015-09-20 MED ORDER — CEFAZOLIN SODIUM-DEXTROSE 2-3 GM-% IV SOLR
2.0000 g | INTRAVENOUS | Status: AC
  Administered 2015-09-20: 2 g via INTRAVENOUS

## 2015-09-20 MED ORDER — PROPOFOL 10 MG/ML IV BOLUS
INTRAVENOUS | Status: DC | PRN
  Administered 2015-09-20: 20 mg via INTRAVENOUS

## 2015-09-20 MED ORDER — PHENYLEPHRINE 40 MCG/ML (10ML) SYRINGE FOR IV PUSH (FOR BLOOD PRESSURE SUPPORT)
PREFILLED_SYRINGE | INTRAVENOUS | Status: AC
  Filled 2015-09-20: qty 10

## 2015-09-20 MED ORDER — SODIUM CHLORIDE 0.9 % IR SOLN
Status: DC | PRN
  Administered 2015-09-20: 1000 mL

## 2015-09-20 MED ORDER — PROPOFOL 500 MG/50ML IV EMUL
INTRAVENOUS | Status: DC | PRN
  Administered 2015-09-20: 50 ug/kg/min via INTRAVENOUS

## 2015-09-20 MED ORDER — POTASSIUM CHLORIDE IN NACL 20-0.9 MEQ/L-% IV SOLN
INTRAVENOUS | Status: DC
  Administered 2015-09-20 – 2015-09-21 (×2): via INTRAVENOUS
  Filled 2015-09-20 (×3): qty 1000

## 2015-09-20 MED ORDER — HYDROMORPHONE HCL 2 MG PO TABS
2.0000 mg | ORAL_TABLET | ORAL | Status: DC | PRN
  Administered 2015-09-20 (×2): 2 mg via ORAL
  Filled 2015-09-20 (×2): qty 1

## 2015-09-20 MED ORDER — METOCLOPRAMIDE HCL 10 MG PO TABS: 5.0000 mg | ORAL_TABLET | Freq: Three times a day (TID) | ORAL | Status: DC | PRN

## 2015-09-20 MED ORDER — DEXAMETHASONE SODIUM PHOSPHATE 10 MG/ML IJ SOLN
10.0000 mg | Freq: Once | INTRAMUSCULAR | Status: AC
  Administered 2015-09-21: 10 mg via INTRAVENOUS
  Filled 2015-09-20: qty 1

## 2015-09-20 MED ORDER — MIDAZOLAM HCL 5 MG/5ML IJ SOLN
INTRAMUSCULAR | Status: DC | PRN
  Administered 2015-09-20: 2 mg via INTRAVENOUS

## 2015-09-20 MED ORDER — LACTATED RINGERS IV SOLN
INTRAVENOUS | Status: DC
  Administered 2015-09-20: 10:00:00 via INTRAVENOUS
  Administered 2015-09-20: 1000 mL via INTRAVENOUS
  Administered 2015-09-20: 10:00:00 via INTRAVENOUS

## 2015-09-20 MED ORDER — SODIUM CHLORIDE 0.9 % IV SOLN: INTRAVENOUS | Status: DC

## 2015-09-20 MED ORDER — MENTHOL 3 MG MT LOZG: 1.0000 | LOZENGE | OROMUCOSAL | Status: DC | PRN

## 2015-09-20 MED ORDER — BUPIVACAINE HCL 0.25 % IJ SOLN
INTRAMUSCULAR | Status: DC | PRN
  Administered 2015-09-20: 20 mL

## 2015-09-20 MED ORDER — ACETAMINOPHEN 650 MG RE SUPP
650.0000 mg | Freq: Four times a day (QID) | RECTAL | Status: DC | PRN
Start: 2015-09-21 — End: 2015-09-22

## 2015-09-20 MED ORDER — ACETAMINOPHEN 500 MG PO TABS
1000.0000 mg | ORAL_TABLET | Freq: Four times a day (QID) | ORAL | Status: AC
  Administered 2015-09-20 – 2015-09-21 (×2): 1000 mg via ORAL
  Filled 2015-09-20 (×3): qty 2

## 2015-09-20 MED ORDER — METOCLOPRAMIDE HCL 5 MG/ML IJ SOLN
5.0000 mg | Freq: Three times a day (TID) | INTRAMUSCULAR | Status: DC | PRN
  Administered 2015-09-20: 10 mg via INTRAVENOUS
  Filled 2015-09-20: qty 2

## 2015-09-20 MED ORDER — BUPIVACAINE LIPOSOME 1.3 % IJ SUSP
20.0000 mL | Freq: Once | INTRAMUSCULAR | Status: DC
  Filled 2015-09-20: qty 20

## 2015-09-20 MED ORDER — ONDANSETRON HCL 4 MG/2ML IJ SOLN
INTRAMUSCULAR | Status: DC | PRN
  Administered 2015-09-20: 4 mg via INTRAVENOUS

## 2015-09-20 MED ORDER — FLEET ENEMA 7-19 GM/118ML RE ENEM: 1.0000 | ENEMA | Freq: Once | RECTAL | Status: DC | PRN

## 2015-09-20 MED ORDER — PHENOL 1.4 % MT LIQD: 1.0000 | OROMUCOSAL | Status: DC | PRN

## 2015-09-20 MED ORDER — DEXAMETHASONE SODIUM PHOSPHATE 10 MG/ML IJ SOLN
10.0000 mg | Freq: Once | INTRAMUSCULAR | Status: AC
  Administered 2015-09-20: 10 mg via INTRAVENOUS

## 2015-09-20 MED ORDER — SODIUM CHLORIDE 0.9 % IJ SOLN
INTRAMUSCULAR | Status: AC
  Filled 2015-09-20: qty 50

## 2015-09-20 MED ORDER — ACETAMINOPHEN 325 MG PO TABS: 650.0000 mg | ORAL_TABLET | Freq: Four times a day (QID) | ORAL | Status: DC | PRN

## 2015-09-20 MED ORDER — TRAMADOL HCL 50 MG PO TABS: 50.0000 mg | ORAL_TABLET | Freq: Four times a day (QID) | ORAL | Status: DC | PRN

## 2015-09-20 MED ORDER — ONDANSETRON HCL 4 MG/2ML IJ SOLN
INTRAMUSCULAR | Status: AC
  Filled 2015-09-20: qty 2

## 2015-09-20 MED ORDER — ACETAMINOPHEN 10 MG/ML IV SOLN
1000.0000 mg | Freq: Once | INTRAVENOUS | Status: AC
  Administered 2015-09-20: 1000 mg via INTRAVENOUS
  Filled 2015-09-20: qty 100

## 2015-09-20 MED ORDER — DIPHENHYDRAMINE HCL 12.5 MG/5ML PO ELIX: 12.5000 mg | ORAL_SOLUTION | ORAL | Status: DC | PRN

## 2015-09-20 MED ORDER — FENTANYL CITRATE (PF) 100 MCG/2ML IJ SOLN
INTRAMUSCULAR | Status: AC
  Filled 2015-09-20: qty 4

## 2015-09-20 MED ORDER — POLYETHYLENE GLYCOL 3350 17 G PO PACK
17.0000 g | PACK | Freq: Every day | ORAL | Status: DC | PRN
  Administered 2015-09-21: 17 g via ORAL
  Filled 2015-09-20: qty 1

## 2015-09-20 MED ORDER — ONDANSETRON HCL 4 MG PO TABS: 4.0000 mg | ORAL_TABLET | Freq: Four times a day (QID) | ORAL | Status: DC | PRN

## 2015-09-20 MED ORDER — DEXAMETHASONE SODIUM PHOSPHATE 10 MG/ML IJ SOLN
INTRAMUSCULAR | Status: AC
  Filled 2015-09-20: qty 1

## 2015-09-20 MED ORDER — SODIUM CHLORIDE 0.9 % IJ SOLN
INTRAMUSCULAR | Status: DC | PRN
  Administered 2015-09-20: 30 mL

## 2015-09-20 MED ORDER — ACETAMINOPHEN 10 MG/ML IV SOLN
INTRAVENOUS | Status: AC
  Filled 2015-09-20: qty 100

## 2015-09-20 MED ORDER — CEFAZOLIN SODIUM-DEXTROSE 2-3 GM-% IV SOLR
INTRAVENOUS | Status: AC
  Filled 2015-09-20: qty 50

## 2015-09-20 MED ORDER — BISACODYL 10 MG RE SUPP: 10.0000 mg | Freq: Every day | RECTAL | Status: DC | PRN

## 2015-09-20 MED ORDER — ALPRAZOLAM 0.25 MG PO TABS: 0.2500 mg | ORAL_TABLET | Freq: Every day | ORAL | Status: DC | PRN

## 2015-09-20 MED ORDER — BUPIVACAINE LIPOSOME 1.3 % IJ SUSP
INTRAMUSCULAR | Status: DC | PRN
  Administered 2015-09-20: 20 mL

## 2015-09-20 MED ORDER — HYDROMORPHONE HCL 1 MG/ML IJ SOLN
0.5000 mg | INTRAMUSCULAR | Status: DC | PRN
  Administered 2015-09-20: 1 mg via INTRAVENOUS
  Administered 2015-09-20: 0.5 mg via INTRAVENOUS
  Administered 2015-09-20: 1 mg via INTRAVENOUS
  Filled 2015-09-20 (×3): qty 1

## 2015-09-20 MED ORDER — TRANEXAMIC ACID 1000 MG/10ML IV SOLN
1000.0000 mg | INTRAVENOUS | Status: AC
  Administered 2015-09-20: 1000 mg via INTRAVENOUS
  Filled 2015-09-20: qty 10

## 2015-09-20 MED ORDER — FENTANYL CITRATE (PF) 100 MCG/2ML IJ SOLN: 25.0000 ug | INTRAMUSCULAR | Status: DC | PRN

## 2015-09-20 MED ORDER — PROPOFOL 10 MG/ML IV BOLUS
INTRAVENOUS | Status: AC
  Filled 2015-09-20: qty 20

## 2015-09-20 MED ORDER — METHOCARBAMOL 1000 MG/10ML IJ SOLN
500.0000 mg | Freq: Four times a day (QID) | INTRAMUSCULAR | Status: DC | PRN
  Administered 2015-09-20: 500 mg via INTRAVENOUS
  Filled 2015-09-20 (×2): qty 5

## 2015-09-20 MED ORDER — PHENYLEPHRINE HCL 10 MG/ML IJ SOLN
INTRAMUSCULAR | Status: DC | PRN
  Administered 2015-09-20 (×3): 80 ug via INTRAVENOUS

## 2015-09-20 MED ORDER — METHOCARBAMOL 500 MG PO TABS
500.0000 mg | ORAL_TABLET | Freq: Four times a day (QID) | ORAL | Status: DC | PRN
  Administered 2015-09-20 – 2015-09-22 (×3): 500 mg via ORAL
  Filled 2015-09-20 (×3): qty 1

## 2015-09-20 MED ORDER — CHLORHEXIDINE GLUCONATE 4 % EX LIQD: 60.0000 mL | Freq: Once | CUTANEOUS | Status: DC

## 2015-09-20 MED ORDER — CEFAZOLIN SODIUM-DEXTROSE 2-3 GM-% IV SOLR
2.0000 g | Freq: Four times a day (QID) | INTRAVENOUS | Status: AC
  Administered 2015-09-20 (×2): 2 g via INTRAVENOUS
  Filled 2015-09-20 (×2): qty 50

## 2015-09-20 MED ORDER — ONDANSETRON HCL 4 MG/2ML IJ SOLN
4.0000 mg | Freq: Four times a day (QID) | INTRAMUSCULAR | Status: DC | PRN
  Administered 2015-09-20: 4 mg via INTRAVENOUS
  Filled 2015-09-20: qty 2

## 2015-09-20 MED ORDER — OMEPRAZOLE MAGNESIUM 20 MG PO TBEC
20.0000 mg | DELAYED_RELEASE_TABLET | Freq: Every morning | ORAL | Status: DC
  Administered 2015-09-21 – 2015-09-22 (×2): 20 mg via ORAL
  Filled 2015-09-20 (×2): qty 1

## 2015-09-20 MED ORDER — FENTANYL CITRATE (PF) 100 MCG/2ML IJ SOLN
INTRAMUSCULAR | Status: DC | PRN
  Administered 2015-09-20: 50 ug via INTRAVENOUS

## 2015-09-20 MED ORDER — HYDROCHLOROTHIAZIDE 25 MG PO TABS
25.0000 mg | ORAL_TABLET | Freq: Every morning | ORAL | Status: DC
  Administered 2015-09-21 – 2015-09-22 (×2): 25 mg via ORAL
  Filled 2015-09-20 (×2): qty 1

## 2015-09-20 MED ORDER — PROMETHAZINE HCL 25 MG/ML IJ SOLN: 6.2500 mg | INTRAMUSCULAR | Status: DC | PRN

## 2015-09-20 MED ORDER — KETOROLAC TROMETHAMINE 15 MG/ML IJ SOLN: 7.5000 mg | Freq: Four times a day (QID) | INTRAMUSCULAR | Status: AC | PRN

## 2015-09-20 MED ORDER — RIVAROXABAN 10 MG PO TABS
10.0000 mg | ORAL_TABLET | Freq: Every day | ORAL | Status: DC
  Administered 2015-09-21 – 2015-09-22 (×2): 10 mg via ORAL
  Filled 2015-09-20 (×3): qty 1

## 2015-09-20 MED ORDER — MEPERIDINE HCL 50 MG/ML IJ SOLN: 6.2500 mg | INTRAMUSCULAR | Status: DC | PRN

## 2015-09-20 MED ORDER — DOCUSATE SODIUM 100 MG PO CAPS
100.0000 mg | ORAL_CAPSULE | Freq: Two times a day (BID) | ORAL | Status: DC
  Administered 2015-09-20 – 2015-09-22 (×4): 100 mg via ORAL

## 2015-09-20 MED ORDER — BUPIVACAINE HCL (PF) 0.25 % IJ SOLN
INTRAMUSCULAR | Status: AC
  Filled 2015-09-20: qty 30

## 2015-09-20 MED ORDER — MIDAZOLAM HCL 2 MG/2ML IJ SOLN
INTRAMUSCULAR | Status: AC
  Filled 2015-09-20: qty 4

## 2015-09-20 MED ORDER — BUPIVACAINE IN DEXTROSE 0.75-8.25 % IT SOLN
INTRATHECAL | Status: DC | PRN
  Administered 2015-09-20: 1.8 mL via INTRATHECAL

## 2015-09-20 SURGICAL SUPPLY — 63 items
BAG DECANTER FOR FLEXI CONT (MISCELLANEOUS) ×3 IMPLANT
BAG ZIPLOCK 12X15 (MISCELLANEOUS) ×3 IMPLANT
BANDAGE ELASTIC 6 VELCRO ST LF (GAUZE/BANDAGES/DRESSINGS) ×3 IMPLANT
BANDAGE ESMARK 6X9 LF (GAUZE/BANDAGES/DRESSINGS) ×1 IMPLANT
BLADE SAG 18X100X1.27 (BLADE) ×3 IMPLANT
BLADE SAW SGTL 11.0X1.19X90.0M (BLADE) ×3 IMPLANT
BNDG ESMARK 6X9 LF (GAUZE/BANDAGES/DRESSINGS) ×3
BOWL SMART MIX CTS (DISPOSABLE) ×3 IMPLANT
CAPT KNEE TOTAL 3 ATTUNE ×3 IMPLANT
CEMENT HV SMART SET (Cement) ×6 IMPLANT
CLOSURE WOUND 1/2 X4 (GAUZE/BANDAGES/DRESSINGS) ×1
CUFF TOURN SGL QUICK 34 (TOURNIQUET CUFF) ×2
CUFF TRNQT CYL 34X4X40X1 (TOURNIQUET CUFF) ×1 IMPLANT
DECANTER SPIKE VIAL GLASS SM (MISCELLANEOUS) ×3 IMPLANT
DRAPE EXTREMITY T 121X128X90 (DRAPE) ×3 IMPLANT
DRAPE POUCH INSTRU U-SHP 10X18 (DRAPES) ×3 IMPLANT
DRAPE U-SHAPE 47X51 STRL (DRAPES) ×3 IMPLANT
DRSG ADAPTIC 3X8 NADH LF (GAUZE/BANDAGES/DRESSINGS) ×3 IMPLANT
DRSG PAD ABDOMINAL 8X10 ST (GAUZE/BANDAGES/DRESSINGS) ×3 IMPLANT
DURAPREP 26ML APPLICATOR (WOUND CARE) ×3 IMPLANT
ELECT REM PT RETURN 9FT ADLT (ELECTROSURGICAL) ×3
ELECTRODE REM PT RTRN 9FT ADLT (ELECTROSURGICAL) ×1 IMPLANT
EVACUATOR 1/8 PVC DRAIN (DRAIN) ×3 IMPLANT
FACESHIELD WRAPAROUND (MASK) ×15 IMPLANT
GAUZE SPONGE 4X4 12PLY STRL (GAUZE/BANDAGES/DRESSINGS) ×3 IMPLANT
GLOVE BIO SURGEON STRL SZ7.5 (GLOVE) ×3 IMPLANT
GLOVE BIO SURGEON STRL SZ8 (GLOVE) ×3 IMPLANT
GLOVE BIOGEL PI IND STRL 6.5 (GLOVE) ×2 IMPLANT
GLOVE BIOGEL PI IND STRL 8 (GLOVE) ×1 IMPLANT
GLOVE BIOGEL PI INDICATOR 6.5 (GLOVE) ×4
GLOVE BIOGEL PI INDICATOR 8 (GLOVE) ×2
GLOVE SURG SS PI 6.5 STRL IVOR (GLOVE) ×3 IMPLANT
GOWN STRL REUS W/TWL LRG LVL3 (GOWN DISPOSABLE) ×3 IMPLANT
GOWN STRL REUS W/TWL XL LVL3 (GOWN DISPOSABLE) IMPLANT
HANDPIECE INTERPULSE COAX TIP (DISPOSABLE) ×2
IMMOBILIZER KNEE 20 (SOFTGOODS) ×3
IMMOBILIZER KNEE 20 THIGH 36 (SOFTGOODS) ×1 IMPLANT
KIT BASIN OR (CUSTOM PROCEDURE TRAY) ×3 IMPLANT
MANIFOLD NEPTUNE II (INSTRUMENTS) ×3 IMPLANT
NDL SAFETY ECLIPSE 18X1.5 (NEEDLE) ×2 IMPLANT
NEEDLE HYPO 18GX1.5 SHARP (NEEDLE) ×4
NS IRRIG 1000ML POUR BTL (IV SOLUTION) ×3 IMPLANT
PACK TOTAL JOINT (CUSTOM PROCEDURE TRAY) ×3 IMPLANT
PAD ABD 8X10 STRL (GAUZE/BANDAGES/DRESSINGS) ×3 IMPLANT
PADDING CAST COTTON 6X4 STRL (CAST SUPPLIES) ×3 IMPLANT
PEN SKIN MARKING BROAD (MISCELLANEOUS) ×3 IMPLANT
POSITIONER SURGICAL ARM (MISCELLANEOUS) ×3 IMPLANT
SET HNDPC FAN SPRY TIP SCT (DISPOSABLE) ×1 IMPLANT
STRIP CLOSURE SKIN 1/2X4 (GAUZE/BANDAGES/DRESSINGS) ×2 IMPLANT
SUCTION FRAZIER 12FR DISP (SUCTIONS) ×3 IMPLANT
SUT MNCRL AB 4-0 PS2 18 (SUTURE) ×3 IMPLANT
SUT VIC AB 2-0 CT1 27 (SUTURE) ×6
SUT VIC AB 2-0 CT1 TAPERPNT 27 (SUTURE) ×3 IMPLANT
SUT VLOC 180 0 24IN GS25 (SUTURE) ×3 IMPLANT
SYR 20CC LL (SYRINGE) ×3 IMPLANT
SYR 50ML LL SCALE MARK (SYRINGE) ×3 IMPLANT
TOWEL OR 17X26 10 PK STRL BLUE (TOWEL DISPOSABLE) ×3 IMPLANT
TOWEL OR NON WOVEN STRL DISP B (DISPOSABLE) ×3 IMPLANT
TRAY FOLEY W/METER SILVER 14FR (SET/KITS/TRAYS/PACK) ×3 IMPLANT
TRAY FOLEY W/METER SILVER 16FR (SET/KITS/TRAYS/PACK) IMPLANT
WATER STERILE IRR 1500ML POUR (IV SOLUTION) ×3 IMPLANT
WRAP KNEE MAXI GEL POST OP (GAUZE/BANDAGES/DRESSINGS) ×3 IMPLANT
YANKAUER SUCT BULB TIP 10FT TU (MISCELLANEOUS) ×3 IMPLANT

## 2015-09-20 NOTE — Evaluation (Signed)
Physical Therapy Evaluation Patient Details Name: Michaela Wood MRN: [id number] DOB: [birth year] Today's Date: 09/20/2015   History of Present Illness  Pt is a 59 year old female s/p R TKA.  Clinical Impression  Pt is s/p R TKA resulting in the deficits listed below (see PT Problem List).  Pt will benefit from skilled PT to increase their independence and safety with mobility to allow discharge to the venue listed below.  Pt able to tolerate short distance ambulation POD #0 and plans to d/c home with spouse.      Follow Up Recommendations Home health PT    Equipment Recommendations  None recommended by PT    Recommendations for Other Services       Precautions / Restrictions Precautions Precautions: Knee Required Braces or Orthoses: Knee Immobilizer - Right Knee Immobilizer - Right: Discontinue once straight leg raise with < 10 degree lag Restrictions Other Position/Activity Restrictions: WBAT      Mobility  Bed Mobility Overal bed mobility: Needs Assistance Bed Mobility: Supine to Sit     Supine to sit: Min assist;HOB elevated     General bed mobility comments: verbal cues for technique, assist for R LE  Transfers Overall transfer level: Needs assistance Equipment used: Rolling walker (2 wheeled) Transfers: Sit to/from Stand Sit to Stand: Min assist         General transfer comment: assist to rise and steady, verbal cues for safe technique  Ambulation/Gait Ambulation/Gait assistance: Min guard Ambulation Distance (Feet): 40 Feet Assistive device: Rolling walker (2 wheeled) Gait Pattern/deviations: Step-to pattern;Antalgic     General Gait Details: verbal cues for sequence, RW distance, step length, posture  Stairs            Wheelchair Mobility    Modified Rankin (Stroke Patients Only)       Balance                                             Pertinent Vitals/Pain Pain Assessment: 0-10 Pain Score: 5  Pain Location: R  knee Pain Descriptors / Indicators: Aching;Sore Pain Intervention(s): Limited activity within patient's tolerance;Monitored during session;Repositioned;Premedicated before session;Ice applied    Home Living Family/patient expects to be discharged to:: Private residence Living Arrangements: Spouse/significant other   Type of Home: House Home Access: Stairs to enter Entrance Stairs-Rails: None Secretary/administrator of Steps: 3 Home Layout: One level Home Equipment: Environmental consultant - 2 wheels      Prior Function Level of Independence: Independent               Hand Dominance        Extremity/Trunk Assessment               Lower Extremity Assessment: RLE deficits/detail RLE Deficits / Details: unable to perform SLR, fair quad contraction, maintained KI       Communication   Communication: No difficulties  Cognition Arousal/Alertness: Awake/alert Behavior During Therapy: WFL for tasks assessed/performed Overall Cognitive Status: Within Functional Limits for tasks assessed                      General Comments      Exercises        Assessment/Plan    PT Assessment Patient needs continued PT services  PT Diagnosis Acute pain;Difficulty walking   PT Problem List Decreased strength;Decreased range of motion;Decreased mobility;Decreased knowledge  of use of DME;Pain;Decreased knowledge of precautions  PT Treatment Interventions Stair training;Gait training;DME instruction;Patient/family education;Functional mobility training;Therapeutic activities;Therapeutic exercise   PT Goals (Current goals can be found in the Care Plan section) Acute Rehab PT Goals PT Goal Formulation: With patient Time For Goal Achievement: 09/24/15 Potential to Achieve Goals: Good    Frequency 7X/week   Barriers to discharge        Co-evaluation               End of Session Equipment Utilized During Treatment: Gait belt;Right knee immobilizer Activity Tolerance: Patient  tolerated treatment well Patient left: with call bell/phone within reach;in chair;with family/visitor present           Time: [id number] PT Time Calculation (min) (ACUTE ONLY): 15 min   Charges:   PT Evaluation $Initial PT Evaluation Tier I: 1 Procedure     PT G Codes:        Conal Shetley,KATHrine E 09/20/2015, 5:42 PM Zenovia JarredKati Orris Perin, PT, DPT 09/20/2015 Pager: [phone]

## 2015-09-20 NOTE — Op Note (Signed)
Pre-operative diagnosis- Osteoarthritis  Right knee(s)  Post-operative diagnosis- Osteoarthritis Right knee(s)  Procedure-  Right  Total Knee Arthroplasty  Surgeon- Gus RankinFrank V. Louanne Calvillo, MD  Assistant- Dimitri PedAmber Constable, PA-C   Anesthesia-  Spinal  EBL-* No blood loss amount entered *   Drains Hemovac  Tourniquet time-  Total Tourniquet Time Documented: Thigh (Right) - 27 minutes Total: Thigh (Right) - 27 minutes     Complications- None  Condition-PACU - hemodynamically stable.   Brief Clinical Note  Michaela Wood is a 59 y.o. year old female with end stage OA of her right knee with progressively worsening pain and dysfunction. She has constant pain, with activity and at rest and significant functional deficits with difficulties even with ADLs. She has had extensive non-op management including analgesics, injections of cortisone and viscosupplements, and home exercise program, but remains in significant pain with significant dysfunction.Radiographs show bone on bone arthritis lateral and patellofemoral. She presents now for right Total Knee Arthroplasty.    Procedure in detail---   The patient is brought into the operating room and positioned supine on the operating table. After successful administration of  Spinal,   a tourniquet is placed high on the  Right thigh(s) and the lower extremity is prepped and draped in the usual sterile fashion. Time out is performed by the operating team and then the  Right lower extremity is wrapped in Esmarch, knee flexed and the tourniquet inflated to 300 mmHg.       A midline incision is made with a ten blade through the subcutaneous tissue to the level of the extensor mechanism. A fresh blade is used to make a medial parapatellar arthrotomy. Soft tissue over the proximal medial tibia is subperiosteally elevated to the joint line with a knife and into the semimembranosus bursa with a Cobb elevator. Soft tissue over the proximal lateral tibia is elevated  with attention being paid to avoiding the patellar tendon on the tibial tubercle. The patella is everted, knee flexed 90 degrees and the ACL and PCL are removed. Findings are bone on bone lateral and patellofemoral with large lateral and patellar osteophytes,        The drill is used to create a starting hole in the distal femur and the canal is thoroughly irrigated with sterile saline to remove the fatty contents. The 5 degree Right  valgus alignment guide is placed into the femoral canal and the distal femoral cutting block is pinned to remove 9 mm off the distal femur. Resection is made with an oscillating saw.      The tibia is subluxed forward and the menisci are removed. The extramedullary alignment guide is placed referencing proximally at the medial aspect of the tibial tubercle and distally along the second metatarsal axis and tibial crest. The block is pinned to remove 2mm off the more deficient lateral  side. Resection is made with an oscillating saw. Size 3is the most appropriate size for the tibia and the proximal tibia is prepared with the modular drill and keel punch for that size.      The femoral sizing guide is placed and size 4 is most appropriate. Rotation is marked off the epicondylar axis and confirmed by creating a rectangular flexion gap at 90 degrees. The size 4 cutting block is pinned in this rotation and the anterior, posterior and chamfer cuts are made with the oscillating saw. The intercondylar block is then placed and that cut is made.      Trial size 3 tibial  component, trial size 4 posterior stabilized femur and a 6  mm posterior stabilized rotating platform insert trial is placed. Full extension is achieved with excellent varus/valgus and anterior/posterior balance throughout full range of motion. The patella is everted and thickness measured to be 22  mm. Free hand resection is taken to 12 mm, a 32 template is placed, lug holes are drilled, trial patella is placed, and it tracks  normally. Osteophytes are removed off the posterior femur with the trial in place. All trials are removed and the cut bone surfaces prepared with pulsatile lavage. Cement is mixed and once ready for implantation, the size 3 tibial implant, size  4 narrow posterior stabilized femoral component, and the size 32 patella are cemented in place and the patella is held with the clamp. The trial insert is placed and the knee held in full extension. The Exparel (20 ml mixed with 30 ml saline) and .25% Bupivicaine, are injected into the extensor mechanism, posterior capsule, medial and lateral gutters and subcutaneous tissues.  All extruded cement is removed and once the cement is hard the permanent 6 mm posterior stabilized rotating platform insert is placed into the tibial tray.      The wound is copiously irrigated with saline solution and the extensor mechanism closed over a hemovac drain with #1 V-loc suture. The tourniquet is released for a total tourniquet time of 27  minutes. Flexion against gravity is 140 degrees and the patella tracks normally. Subcutaneous tissue is closed with 2.0 vicryl and subcuticular with running 4.0 Monocryl. The incision is cleaned and dried and steri-strips and a bulky sterile dressing are applied. The limb is placed into a knee immobilizer and the patient is awakened and transported to recovery in stable condition.      Please note that a surgical assistant was a medical necessity for this procedure in order to perform it in a safe and expeditious manner. Surgical assistant was necessary to retract the ligaments and vital neurovascular structures to prevent injury to them and also necessary for proper positioning of the limb to allow for anatomic placement of the prosthesis.   Gus Rankin [name], MD    09/20/2015, 10:02 AM

## 2015-09-20 NOTE — Anesthesia Postprocedure Evaluation (Signed)
  Anesthesia Post-op Note  Patient: Michaela Wood  Procedure(s) Performed: Procedure(s): TOTAL RIGHT KNEE ARTHROPLASTY (Right)  Patient Location: PACU  Anesthesia Type:Spinal and MAC combined with regional for post-op pain  Level of Consciousness: awake and alert   Airway and Oxygen Therapy: Patient Spontanous Breathing  Post-op Pain: mild  Post-op Assessment: Post-op Vital signs reviewed              Post-op Vital Signs: Reviewed and stable  Last Vitals:  Filed Vitals:   09/20/15 0619  BP: 143/83  Pulse: 68  Temp: 36.6 C  Resp: 18    Complications: No apparent anesthesia complications

## 2015-09-20 NOTE — Anesthesia Procedure Notes (Signed)
Spinal Patient location during procedure: OR Start time: 09/20/2015 9:05 AM End time: 09/20/2015 9:10 AM Staffing Anesthesiologist: Alexis Frock Resident/CRNA: Darlys Gales R Performed by: resident/CRNA  Preanesthetic Checklist Completed: patient identified, site marked, surgical consent, pre-op evaluation, timeout performed, IV checked, risks and benefits discussed and monitors and equipment checked Spinal Block Patient position: sitting Prep: Betadine Patient monitoring: heart rate, continuous pulse ox and blood pressure Location: L3-4 Injection technique: single-shot Needle Needle type: Spinocan  Needle gauge: 22 G Needle length: 9 cm Needle insertion depth: 6.5 cm Assessment Sensory level: T6 Additional Notes Expiration date of kit checked and confirmed. Patient tolerated procedure well, without complications.

## 2015-09-20 NOTE — Plan of Care (Signed)
Problem: Phase I Progression Outcomes Goal: Initial discharge plan identified Outcome: Completed/Met Date Met:  09/20/15 Pt plans to go home with the assistance of her husband.

## 2015-09-20 NOTE — Transfer of Care (Addendum)
Immediate Anesthesia Transfer of Care Note  Patient: Michaela MarkerJoan E Wood  Procedure(s) Performed: Procedure(s): TOTAL RIGHT KNEE ARTHROPLASTY (Right)  Patient Location: PACU  Anesthesia Type:Spinal  Level of Consciousness:  sedated, patient cooperative and responds to stimulation  Airway & Oxygen Therapy:Patient Spontanous Breathing and Patient connected to face mask oxgen  Post-op Assessment:  Report given to PACU RN and Post -op Vital signs reviewed and stable  Post vital signs:  Reviewed and stable, spinal level 1  Last Vitals:  Filed Vitals:   09/20/15 0619  BP: 143/83  Pulse: 68  Temp: 36.6 C  Resp: 18    Complications: No apparent anesthesia complications

## 2015-09-20 NOTE — Interval H&P Note (Signed)
History and Physical Interval Note:  09/20/2015 6:42 AM  Michaela Wood  has presented today for surgery, with the diagnosis of OA RIGHT KNEE   The various methods of treatment have been discussed with the patient and family. After consideration of risks, benefits and other options for treatment, the patient has consented to  Procedure(s): TOTAL RIGHT KNEE ARTHROPLASTY (Right) as a surgical intervention .  The patient's history has been reviewed, patient examined, no change in status, stable for surgery.  I have reviewed the patient's chart and labs.  Questions were answered to the patient's satisfaction.     Loanne DrillingALUISIO,Aquan Kope V

## 2015-09-21 LAB — BASIC METABOLIC PANEL
Anion gap: 6 (ref 5–15)
BUN: 14 mg/dL (ref 6–20)
CALCIUM: 8.6 mg/dL — AB (ref 8.9–10.3)
CO2: 25 mmol/L (ref 22–32)
CREATININE: 0.72 mg/dL (ref 0.44–1.00)
Chloride: 108 mmol/L (ref 101–111)
GFR calc non Af Amer: >60 mL/min (ref >60)
Glucose, Bld: 138 mg/dL — ABNORMAL HIGH (ref 65–99)
Potassium: 3.6 mmol/L (ref 3.5–5.1)
Sodium: 139 mmol/L (ref 135–145)

## 2015-09-21 LAB — CBC
HEMATOCRIT: 33.2 % — AB (ref 36.0–46.0)
Hemoglobin: 11.3 g/dL — ABNORMAL LOW (ref 12.0–15.0)
MCH: 31.2 pg (ref 26.0–34.0)
MCHC: 34 g/dL (ref 30.0–36.0)
MCV: 91.7 fL (ref 78.0–100.0)
Platelets: 276 10*3/uL (ref 150–400)
RBC: 3.62 MIL/uL — AB (ref 3.87–5.11)
RDW: 12.7 % (ref 11.5–15.5)
WBC: 13.9 10*3/uL — AB (ref 4.0–10.5)

## 2015-09-21 MED ORDER — HYDROCODONE-ACETAMINOPHEN 5-325 MG PO TABS: 1.0000 | ORAL_TABLET | ORAL | Status: DC | PRN

## 2015-09-21 MED ORDER — RIVAROXABAN 10 MG PO TABS: 10.0000 mg | ORAL_TABLET | Freq: Every day | ORAL | Status: DC

## 2015-09-21 MED ORDER — HYDROCODONE-ACETAMINOPHEN 5-325 MG PO TABS
1.0000 | ORAL_TABLET | ORAL | Status: DC | PRN
  Administered 2015-09-21: 2 via ORAL
  Administered 2015-09-21: 1 via ORAL
  Administered 2015-09-21: 2 via ORAL
  Administered 2015-09-21: 1 via ORAL
  Administered 2015-09-21 – 2015-09-22 (×3): 2 via ORAL
  Administered 2015-09-22: 1 via ORAL
  Filled 2015-09-21: qty 2
  Filled 2015-09-21: qty 1
  Filled 2015-09-21 (×3): qty 2
  Filled 2015-09-21: qty 1
  Filled 2015-09-21: qty 2
  Filled 2015-09-21: qty 1

## 2015-09-21 MED ORDER — METHOCARBAMOL 500 MG PO TABS: 500.0000 mg | ORAL_TABLET | Freq: Four times a day (QID) | ORAL | Status: AC | PRN

## 2015-09-21 MED ORDER — TRAMADOL HCL 50 MG PO TABS: 50.0000 mg | ORAL_TABLET | Freq: Four times a day (QID) | ORAL | Status: DC | PRN

## 2015-09-21 NOTE — Progress Notes (Signed)
Physical Therapy Treatment Patient Details Name: Michaela MarkerJoan E Wood MRN: [id number] DOB: 05/19/1956 Today's Date: 09/21/2015    History of Present Illness Pt is a 59 year old female s/p R TKA.    PT Comments    POD # 1 pm session.  Assisted OOB to bathroom then amb in hallway with increased time and tolerated increased distance.  Assisted back to bed.  Pt plans to D/C to home.   Follow Up Recommendations  Home health PT     Equipment Recommendations  None recommended by PT    Recommendations for Other Services       Precautions / Restrictions Precautions Precautions: Knee Precaution Comments: instructed on KI use for amb and stairs Required Braces or Orthoses: Knee Immobilizer - Right Knee Immobilizer - Right: Discontinue once straight leg raise with < 10 degree lag Restrictions Weight Bearing Restrictions: No Other Position/Activity Restrictions: WBAT    Mobility  Bed Mobility Overal bed mobility: Needs Assistance Bed Mobility: Supine to Sit;Sit to Supine     Supine to sit: Min guard;Supervision Sit to supine: Min assist   General bed mobility comments: cues for safety and increased time  Transfers Overall transfer level: Needs assistance Equipment used: Rolling walker (2 wheeled) Transfers: Sit to/from Stand Sit to Stand: Min guard         General transfer comment: cues for hand placement and LE management.  Ambulation/Gait Ambulation/Gait assistance: Min guard Ambulation Distance (Feet): 55 Feet Assistive device: Rolling walker (2 wheeled)   Gait velocity: VC's to decrease gait speed to increase safety   General Gait Details: verbal cues for sequence, RW distance, step length, posture   Stairs            Wheelchair Mobility    Modified Rankin (Stroke Patients Only)       Balance                                    Cognition Arousal/Alertness: Awake/alert Behavior During Therapy: WFL for tasks assessed/performed Overall  Cognitive Status: Within Functional Limits for tasks assessed                      Exercises      General Comments        Pertinent Vitals/Pain Pain Assessment: 0-10 Pain Score: 5  Pain Location: R knee Pain Descriptors / Indicators: Sore;Constant Pain Intervention(s): Repositioned;Ice applied    Home Living                      Prior Function            PT Goals (current goals can now be found in the care plan section) Acute Rehab PT Goals Patient Stated Goal: return to independence. Progress towards PT goals: Progressing toward goals    Frequency  7X/week    PT Plan Current plan remains appropriate    Co-evaluation             End of Session Equipment Utilized During Treatment: Gait belt;Right knee immobilizer Activity Tolerance: Patient tolerated treatment well Patient left: in chair;with family/visitor present;with call bell/phone within reach     Time: 1450-1513 PT Time Calculation (min) (ACUTE ONLY): 23 min  Charges:  $Gait Training: 8-22 mins $Therapeutic Activity: 8-22 mins                    G Codes:  Rica Koyanagi  PTA [facility]  Acute  Rehab Pager      [phone]

## 2015-09-21 NOTE — Discharge Instructions (Addendum)
° °Dr. Frank Aluisio °Total Joint Specialist °[facility] °[address] °[place], [place] [address] °[phone] ° °TOTAL KNEE REPLACEMENT POSTOPERATIVE DIRECTIONS ° °Knee Rehabilitation, Guidelines Following Surgery  °Results after knee surgery are often greatly improved when you follow the exercise, range of motion and muscle strengthening exercises prescribed by your doctor. Safety measures are also important to protect the knee from further injury. Any time any of these exercises cause you to have increased pain or swelling in your knee joint, decrease the amount until you are comfortable again and slowly increase them. If you have problems or questions, call your caregiver or physical therapist for advice.  ° °HOME CARE INSTRUCTIONS  °Remove items at home which could result in a fall. This includes throw rugs or furniture in walking pathways.  °· ICE to the affected knee every three hours for 30 minutes at a time and then as needed for pain and swelling.  Continue to use ice on the knee for pain and swelling from surgery. You may notice swelling that will progress down to the foot and ankle.  This is normal after surgery.  Elevate the leg when you are not up walking on it.   °· Continue to use the breathing machine which will help keep your temperature down.  It is common for your temperature to cycle up and down following surgery, especially at night when you are not up moving around and exerting yourself.  The breathing machine keeps your lungs expanded and your temperature down. °· Do not place pillow under knee, focus on keeping the knee straight while resting ° °DIET °You may resume your previous home diet once your are discharged from the hospital. ° °DRESSING / WOUND CARE / SHOWERING °You may shower 3 days after surgery, but keep the wounds dry during showering.  You may use an occlusive plastic wrap (Press'n Seal for example), NO SOAKING/SUBMERGING IN THE BATHTUB.  If the  bandage gets wet, change with a clean dry gauze.  If the incision gets wet, pat the wound dry with a clean towel. °You may start showering once you are discharged home but do not submerge the incision under water. Just pat the incision dry and apply a dry gauze dressing on daily. °Change the surgical dressing daily and reapply a dry dressing each time. ° °ACTIVITY °Walk with your walker as instructed. °Use walker as long as suggested by your caregivers. °Avoid periods of inactivity such as sitting longer than an hour when not asleep. This helps prevent blood clots.  °You may resume a sexual relationship in one month or when given the OK by your doctor.  °You may return to work once you are cleared by your doctor.  °Do not drive a car for 6 weeks or until released by you surgeon.  °Do not drive while taking narcotics. ° °WEIGHT BEARING °Weight bearing as tolerated with assist device (walker, cane, etc) as directed, use it as long as suggested by your surgeon or therapist, typically at least 4-6 weeks. ° °POSTOPERATIVE CONSTIPATION PROTOCOL °Constipation - defined medically as fewer than three stools per week and severe constipation as less than one stool per week. ° °One of the most common issues patients have following surgery is constipation.  Even if you have a regular bowel pattern at home, your normal regimen is likely to be disrupted due to multiple reasons following surgery.  Combination of anesthesia, postoperative narcotics, change in appetite and fluid intake all can affect your bowels.    In order to avoid complications following surgery, here are some recommendations in order to help you during your recovery period. ° °Colace (docusate) - Pick up an over-the-counter form of Colace or another stool softener and take twice a day as long as you are requiring postoperative pain medications.  Take with a full glass of water daily.  If you experience loose stools or diarrhea, hold the colace until you stool forms  back up.  If your symptoms do not get better within 1 week or if they get worse, check with your doctor. ° °Dulcolax (bisacodyl) - Pick up over-the-counter and take as directed by the product packaging as needed to assist with the movement of your bowels.  Take with a full glass of water.  Use this product as needed if not relieved by Colace only.  ° °MiraLax (polyethylene glycol) - Pick up over-the-counter to have on hand.  MiraLax is a solution that will increase the amount of water in your bowels to assist with bowel movements.  Take as directed and can mix with a glass of water, juice, soda, coffee, or tea.  Take if you go more than two days without a movement. °Do not use MiraLax more than once per day. Call your doctor if you are still constipated or irregular after using this medication for 7 days in a row. ° °If you continue to have problems with postoperative constipation, please contact the office for further assistance and recommendations.  If you experience "the worst abdominal pain ever" or develop nausea or vomiting, please contact the office immediatly for further recommendations for treatment. ° °ITCHING ° If you experience itching with your medications, try taking only a single pain pill, or even half a pain pill at a time.  You can also use Benadryl over the counter for itching or also to help with sleep.  ° °TED HOSE STOCKINGS °Wear the elastic stockings on both legs for three weeks following surgery during the day but you may remove then at night for sleeping. ° °MEDICATIONS °See your medication summary on the “After Visit Summary” that the nursing staff will review with you prior to discharge.  You may have some home medications which will be placed on hold until you complete the course of blood thinner medication.  It is important for you to complete the blood thinner medication as prescribed by your surgeon.  Continue your approved medications as instructed at time of  discharge. ° °PRECAUTIONS °If you experience chest pain or shortness of breath - call 911 immediately for transfer to the hospital emergency department.  °If you develop a fever greater that 101 F, purulent drainage from wound, increased redness or drainage from wound, foul odor from the wound/dressing, or calf pain - CONTACT YOUR SURGEON.   °                                                °FOLLOW-UP APPOINTMENTS °Make sure you keep all of your appointments after your operation with your surgeon and caregivers. You should call the office at the above phone number and make an appointment for approximately two weeks after the date of your surgery or on the date instructed by your surgeon outlined in the "After Visit Summary". ° ° °RANGE OF MOTION AND STRENGTHENING EXERCISES  °Rehabilitation of the knee is important following a knee injury or   an operation. After just a few days of immobilization, the muscles of the thigh which control the knee become weakened and shrink (atrophy). Knee exercises are designed to build up the tone and strength of the thigh muscles and to improve knee motion. Often times heat used for twenty to thirty minutes before working out will loosen up your tissues and help with improving the range of motion but do not use heat for the first two weeks following surgery. These exercises can be done on a training (exercise) mat, on the floor, on a table or on a bed. Use what ever works the best and is most comfortable for you Knee exercises include:  °Leg Lifts - While your knee is still immobilized in a splint or cast, you can do straight leg raises. Lift the leg to 60 degrees, hold for 3 sec, and slowly lower the leg. Repeat 10-20 times 2-3 times daily. Perform this exercise against resistance later as your knee gets better.  °Quad and Hamstring Sets - Tighten up the muscle on the front of the thigh (Quad) and hold for 5-10 sec. Repeat this 10-20 times hourly. Hamstring sets are done by pushing the  foot backward against an object and holding for 5-10 sec. Repeat as with quad sets.  °· Leg Slides: Lying on your back, slowly slide your foot toward your buttocks, bending your knee up off the floor (only go as far as is comfortable). Then slowly slide your foot back down until your leg is flat on the floor again. °· Angel Wings: Lying on your back spread your legs to the side as far apart as you can without causing discomfort.  °A rehabilitation program following serious knee injuries can speed recovery and prevent re-injury in the future due to weakened muscles. Contact your doctor or a physical therapist for more information on knee rehabilitation.  ° °IF YOU ARE TRANSFERRED TO A SKILLED REHAB FACILITY °If the patient is transferred to a skilled rehab facility following release from the hospital, a list of the current medications will be sent to the facility for the patient to continue.  When discharged from the skilled rehab facility, please have the facility set up the patient's Home Health Physical Therapy prior to being released. Also, the skilled facility will be responsible for providing the patient with their medications at time of release from the facility to include their pain medication, the muscle relaxants, and their blood thinner medication. If the patient is still at the rehab facility at time of the two week follow up appointment, the skilled rehab facility will also need to assist the patient in arranging follow up appointment in our office and any transportation needs. ° °MAKE SURE YOU:  °Understand these instructions.  °Get help right away if you are not doing well or get worse.  ° ° °Pick up stool softner and laxative for home use following surgery while on pain medications. °Do not submerge incision under water. °Please use good hand washing techniques while changing dressing each day. °May shower starting three days after surgery. °Please use a clean towel to pat the incision dry following  showers. °Continue to use ice for pain and swelling after surgery. °Do not use any lotions or creams on the incision until instructed by your surgeon. ° °Take Xarelto for two and a half more weeks, then discontinue Xarelto. °Once the patient has completed the blood thinner regimen, then take a Baby 81 mg Aspirin daily for three more weeks. ° ° °Information   on my medicine - XARELTO® (Rivaroxaban) ° °This medication education was reviewed with me or my healthcare representative as part of my discharge preparation.  The pharmacist that spoke with me during my hospital stay was:  Runyon, Amanda, RPH ° °Why was Xarelto® prescribed for you? °Xarelto® was prescribed for you to reduce the risk of blood clots forming after orthopedic surgery. The medical term for these abnormal blood clots is venous thromboembolism (VTE). ° °What do you need to know about xarelto® ? °Take your Xarelto® ONCE DAILY at the same time every day. °You may take it either with or without food. ° °If you have difficulty swallowing the tablet whole, you may crush it and mix in applesauce just prior to taking your dose. ° °Take Xarelto® exactly as prescribed by your doctor and DO NOT stop taking Xarelto® without talking to the doctor who prescribed the medication.  Stopping without other VTE prevention medication to take the place of Xarelto® may increase your risk of developing a clot. ° °After discharge, you should have regular check-up appointments with your healthcare provider that is prescribing your Xarelto®.   ° °What do you do if you miss a dose? °If you miss a dose, take it as soon as you remember on the same day then continue your regularly scheduled once daily regimen the next day. Do not take two doses of Xarelto® on the same day.  ° °Important Safety Information °A possible side effect of Xarelto® is bleeding. You should call your healthcare provider right away if you experience any of the following: °? Bleeding from an injury or your nose  that does not stop. °? Unusual colored urine (red or dark brown) or unusual colored stools (red or black). °? Unusual bruising for unknown reasons. °? A serious fall or if you hit your head (even if there is no bleeding). ° °Some medicines may interact with Xarelto® and might increase your risk of bleeding while on Xarelto®. To help avoid this, consult your healthcare provider or pharmacist prior to using any new prescription or non-prescription medications, including herbals, vitamins, non-steroidal anti-inflammatory drugs (NSAIDs) and supplements. ° °This website has more information on Xarelto®: www.xarelto.com. ° ° °

## 2015-09-21 NOTE — Progress Notes (Signed)
Physical Therapy Treatment Patient Details Name: Michaela Wood MRN: [id number] DOB: [birth year] Today's Date: 09/21/2015    History of Present Illness Pt is a 59 year old female s/p R TKA.    PT Comments    POD # 1 am session.  Applied KI and assisted OOB to amb in hallway then return to room to perform TKR TE's following HEP handout and ICE.    Follow Up Recommendations  Home health PT     Equipment Recommendations  None recommended by PT    Recommendations for Other Services       Precautions / Restrictions Precautions Precautions: Knee Precaution Comments: instructed on KI use for amb and stairs Required Braces or Orthoses: Knee Immobilizer - Right Knee Immobilizer - Right: Discontinue once straight leg raise with < 10 degree lag Restrictions Weight Bearing Restrictions: No Other Position/Activity Restrictions: WBAT    Mobility  Bed Mobility Overal bed mobility: Needs Assistance Bed Mobility: Supine to Sit;Sit to Supine     Supine to sit: Min guard;Supervision Sit to supine: Min assist   General bed mobility comments: cues for safety and increased time  Transfers Overall transfer level: Needs assistance Equipment used: Rolling walker (2 wheeled) Transfers: Sit to/from Stand Sit to Stand: Min guard         General transfer comment: cues for hand placement and LE management.  Ambulation/Gait Ambulation/Gait assistance: Min guard Ambulation Distance (Feet): 45 Feet Assistive device: Rolling walker (2 wheeled)   Gait velocity: VC's to decrease gait speed to increase safety   General Gait Details: verbal cues for sequence, RW distance, step length, posture   Stairs            Wheelchair Mobility    Modified Rankin (Stroke Patients Only)       Balance                                    Cognition Arousal/Alertness: Awake/alert Behavior During Therapy: WFL for tasks assessed/performed Overall Cognitive Status: Within  Functional Limits for tasks assessed                      Exercises   Total Knee Replacement TE's 10 reps B LE ankle pumps 10 reps towel squeezes 10 reps knee presses 10 reps heel slides  10 reps SAQ's 10 reps SLR's 10 reps ABD Followed by ICE     General Comments        Pertinent Vitals/Pain Pain Assessment: 0-10 Pain Score: 5  Pain Location: R knee Pain Descriptors / Indicators: Sore;Constant Pain Intervention(s): Repositioned;Ice applied    Home Living Family/patient expects to be discharged to:: Private residence Living Arrangements: Spouse/significant other   Type of Home: House Home Access: Stairs to enter Entrance Stairs-Rails: None Home Layout: One level Home Equipment: Environmental consultant - 2 wheels;Shower seat - built in      Prior Function Level of Independence: Independent          PT Goals (current goals can now be found in the care plan section) Acute Rehab PT Goals Patient Stated Goal: return to independence. Progress towards PT goals: Progressing toward goals    Frequency  7X/week    PT Plan Current plan remains appropriate    Co-evaluation             End of Session Equipment Utilized During Treatment: Gait belt;Right knee immobilizer Activity Tolerance: Patient tolerated treatment  well Patient left: in chair;with family/visitor present;with call bell/phone within reach     Time: 0905-0935 PT Time Calculation (min) (ACUTE ONLY): 30 min  Charges:  $Gait Training: 8-22 mins $Therapeutic Exercise: 8-22 mins                    G Codes:      Felecia ShellingLori Lavonn Maxcy  PTA [facility]  Acute  Rehab Pager      [phone]

## 2015-09-21 NOTE — Discharge Summary (Signed)
Physician Discharge Summary   Patient ID: Michaela Wood MRN: [id number] DOB/AGE: October 13, 1956 59 y.o.  Admit date: 09/20/2015 Discharge date: 09-22-2015  Primary Diagnosis:  Osteoarthritis Right knee(s)  Admission Diagnoses:  Past Medical History  Diagnosis Date  . PONV (postoperative nausea and vomiting)     only with wisdom teeth extraction  . Hypertension   . Abnormal EKG     "told bundle branch blockage"-EKG brought in 09-11-07 copy with chart  . Varicose veins 11-04-13    oral varicose veins-surgically excised  . Arthritis   . Bruises easily     hx. of. Fell" knee gave way" Right cheek with recent suture removal  . Varicose veins 11-04-13    bilateral legs-no problems-09-15-15 remains not a problem  . Diabetes mellitus without complication ([place])     recently told- no meds as of yet  . GERD (gastroesophageal reflux disease)    Discharge Diagnoses:   Principal Problem:   OA (osteoarthritis) of knee  Estimated body mass index is 19.49 kg/(m^2) as calculated from the following:   Height as of this encounter: 5' 0.5" (1.537 m).   Weight as of this encounter: 46.04 kg (101 lb 8 oz).  Procedure:  Procedure(s) (LRB): TOTAL RIGHT KNEE ARTHROPLASTY (Right)   Consults: None  HPI: Michaela Wood is a 59 y.o. year old female with end stage OA of her right knee with progressively worsening pain and dysfunction. She has constant pain, with activity and at rest and significant functional deficits with difficulties even with ADLs. She has had extensive non-op management including analgesics, injections of cortisone and viscosupplements, and home exercise program, but remains in significant pain with significant dysfunction.Radiographs show bone on bone arthritis lateral and patellofemoral. She presents now for right Total Knee Arthroplasty.   Laboratory Data: Admission on 09/20/2015  Component Date Value Ref Range Status  . Sodium 09/20/2015 139  135 - 145 mmol/L Final  .  Potassium 09/20/2015 3.9  3.5 - 5.1 mmol/L Final  . Chloride 09/20/2015 104  101 - 111 mmol/L Final  . CO2 09/20/2015 26  22 - 32 mmol/L Final  . Glucose, Bld 09/20/2015 136* 65 - 99 mg/dL Final  . BUN 09/20/2015 16  6 - 20 mg/dL Final  . Creatinine, Ser 09/20/2015 0.71  0.44 - 1.00 mg/dL Final  . Calcium 09/20/2015 9.2  8.9 - 10.3 mg/dL Final  . GFR calc non Af Amer 09/20/2015 >60  >60 mL/min Final  . GFR calc Af Amer 09/20/2015 >60  >60 mL/min Final   Comment: (NOTE) The eGFR has been calculated using the CKD EPI equation. This calculation has not been validated in all clinical situations. eGFR's persistently <60 mL/min signify possible Chronic Kidney Disease.   . Anion gap 09/20/2015 9  5 - 15 Final  . Glucose-Capillary 09/20/2015 115* 65 - 99 mg/dL Final  . Comment 1 09/20/2015 Notify RN   Final  . WBC 09/21/2015 13.9* 4.0 - 10.5 K/uL Final  . RBC 09/21/2015 3.62* 3.87 - 5.11 MIL/uL Final  . Hemoglobin 09/21/2015 11.3* 12.0 - 15.0 g/dL Final  . HCT 09/21/2015 33.2* 36.0 - 46.0 % Final  . MCV 09/21/2015 91.7  78.0 - 100.0 fL Final  . MCH 09/21/2015 31.2  26.0 - 34.0 pg Final  . MCHC 09/21/2015 34.0  30.0 - 36.0 g/dL Final  . RDW 09/21/2015 12.7  11.5 - 15.5 % Final  . Platelets 09/21/2015 276  150 - 400 K/uL Final  . Sodium 09/21/2015 139  135 -  145 mmol/L Final  . Potassium 09/21/2015 3.6  3.5 - 5.1 mmol/L Final  . Chloride 09/21/2015 108  101 - 111 mmol/L Final  . CO2 09/21/2015 25  22 - 32 mmol/L Final  . Glucose, Bld 09/21/2015 138* 65 - 99 mg/dL Final  . BUN 09/21/2015 14  6 - 20 mg/dL Final  . Creatinine, Ser 09/21/2015 0.72  0.44 - 1.00 mg/dL Final  . Calcium 09/21/2015 8.6* 8.9 - 10.3 mg/dL Final  . GFR calc non Af Amer 09/21/2015 >60  >60 mL/min Final  . GFR calc Af Amer 09/21/2015 >60  >60 mL/min Final   Comment: (NOTE) The eGFR has been calculated using the CKD EPI equation. This calculation has not been validated in all clinical situations. eGFR's persistently  <60 mL/min signify possible Chronic Kidney Disease.   Georgiann Hahn gap 09/21/2015 6  5 - 15 Final  Hospital Outpatient Visit on 09/15/2015  Component Date Value Ref Range Status  . MRSA, PCR 09/15/2015 NEGATIVE  NEGATIVE Final  . Staphylococcus aureus 09/15/2015 POSITIVE* NEGATIVE Final   Comment:        The Xpert SA Assay (FDA approved for NASAL specimens in patients over 98 years of age), is one component of a comprehensive surveillance program.  Test performance has been validated by [facility] for patients greater than or equal to 30 year old. It is not intended to diagnose infection nor to guide or monitor treatment.   Marland Kitchen aPTT 09/15/2015 29  24 - 37 seconds Final  . WBC 09/15/2015 6.2  4.0 - 10.5 K/uL Final  . RBC 09/15/2015 4.51  3.87 - 5.11 MIL/uL Final  . Hemoglobin 09/15/2015 13.8  12.0 - 15.0 g/dL Final  . HCT 09/15/2015 40.8  36.0 - 46.0 % Final  . MCV 09/15/2015 90.5  78.0 - 100.0 fL Final  . MCH 09/15/2015 30.6  26.0 - 34.0 pg Final  . MCHC 09/15/2015 33.8  30.0 - 36.0 g/dL Final  . RDW 09/15/2015 12.7  11.5 - 15.5 % Final  . Platelets 09/15/2015 286  150 - 400 K/uL Final  . Sodium 09/15/2015 140  135 - 145 mmol/L Final  . Potassium 09/15/2015 2.8* 3.5 - 5.1 mmol/L Final  . Chloride 09/15/2015 99* 101 - 111 mmol/L Final  . CO2 09/15/2015 30  22 - 32 mmol/L Final  . Glucose, Bld 09/15/2015 108* 65 - 99 mg/dL Final  . BUN 09/15/2015 15  6 - 20 mg/dL Final  . Creatinine, Ser 09/15/2015 0.65  0.44 - 1.00 mg/dL Final  . Calcium 09/15/2015 9.5  8.9 - 10.3 mg/dL Final  . Total Protein 09/15/2015 7.7  6.5 - 8.1 g/dL Final  . Albumin 09/15/2015 4.4  3.5 - 5.0 g/dL Final  . AST 09/15/2015 19  15 - 41 U/L Final  . ALT 09/15/2015 19  14 - 54 U/L Final  . Alkaline Phosphatase 09/15/2015 63  38 - 126 U/L Final  . Total Bilirubin 09/15/2015 0.6  0.3 - 1.2 mg/dL Final  . GFR calc non Af Amer 09/15/2015 >60  >60 mL/min Final  . GFR calc Af Amer 09/15/2015 >60  >60 mL/min Final    Comment: (NOTE) The eGFR has been calculated using the CKD EPI equation. This calculation has not been validated in all clinical situations. eGFR's persistently <60 mL/min signify possible Chronic Kidney Disease.   . Anion gap 09/15/2015 11  5 - 15 Final  . Prothrombin Time 09/15/2015 13.4  11.6 - 15.2 seconds Final  . INR  09/15/2015 1.00  0.00 - 1.49 Final  . ABO/RH(D) 09/15/2015 B POS   Final  . Antibody Screen 09/15/2015 NEG   Final  . Sample Expiration 09/15/2015 09/23/2015   Final  . Color, Urine 09/15/2015 YELLOW  YELLOW Final  . APPearance 09/15/2015 CLEAR  CLEAR Final  . Specific Gravity, Urine 09/15/2015 1.007  1.005 - 1.030 Final  . pH 09/15/2015 7.5  5.0 - 8.0 Final  . Glucose, UA 09/15/2015 NEGATIVE  NEGATIVE mg/dL Final  . Hgb urine dipstick 09/15/2015 NEGATIVE  NEGATIVE Final  . Bilirubin Urine 09/15/2015 NEGATIVE  NEGATIVE Final  . Ketones, ur 09/15/2015 NEGATIVE  NEGATIVE mg/dL Final  . Protein, ur 09/15/2015 NEGATIVE  NEGATIVE mg/dL Final  . Urobilinogen, UA 09/15/2015 0.2  0.0 - 1.0 mg/dL Final  . Nitrite 09/15/2015 NEGATIVE  NEGATIVE Final  . Leukocytes, UA 09/15/2015 NEGATIVE  NEGATIVE Final   MICROSCOPIC NOT DONE ON URINES WITH NEGATIVE PROTEIN, BLOOD, LEUKOCYTES, NITRITE, OR GLUCOSE <1000 mg/dL.  . ABO/RH(D) 09/15/2015 B POS   Final     X-Rays:No results found.  EKG: Orders placed or performed during the hospital encounter of 11/05/13  . EKG     Hospital Course: Michaela Wood is a 59 y.o. who was admitted to [facility]. They were brought to the operating room on 09/20/2015 and underwent Procedure(s): TOTAL RIGHT KNEE ARTHROPLASTY.  Patient tolerated the procedure well and was later transferred to the recovery room and then to the orthopaedic floor for postoperative care.  They were given PO and IV analgesics for pain control following their surgery.  They were given 24 hours of postoperative antibiotics of  Anti-infectives    Start     Dose/Rate  Route Frequency Ordered Stop   09/20/15 1500  ceFAZolin (ANCEF) IVPB 2 g/50 mL premix     2 g 100 mL/hr over 30 Minutes Intravenous Every 6 hours 09/20/15 1130 09/20/15 2138   09/20/15 0620  ceFAZolin (ANCEF) IVPB 2 g/50 mL premix     2 g 100 mL/hr over 30 Minutes Intravenous On call to O.R. 09/20/15 2979 09/20/15 0912     and started on DVT prophylaxis in the form of Xarelto.   PT and OT were ordered for total joint protocol.  Discharge planning consulted to help with postop disposition and equipment needs.  Patient had a rough night on the evening of surgery with pain and nausea.  They started to get up OOB with therapy on day one. Hemovac drain was pulled without difficulty.  Continued to work with therapy into day two.  Dressing was changed on day two and the incision was healing well and they were feeling better. Patient was seen in rounds and was ready to go home.  Discharge home with home health Diet - Cardiac diet Follow up - in 2 weeks Activity - WBAT Disposition - Home Condition Upon Discharge - Good D/C Meds - See DC Summary DVT Prophylaxis - Xarelto  Discharge Instructions    Call MD / Call 911    Complete by:  As directed   If you experience chest pain or shortness of breath, CALL 911 and be transported to the hospital emergency room.  If you develope a fever above 101 F, pus (white drainage) or increased drainage or redness at the wound, or calf pain, call your surgeon's office.     Change dressing    Complete by:  As directed   Change dressing daily with sterile 4 x 4 inch gauze dressing  and apply TED hose. Do not submerge the incision under water.     Constipation Prevention    Complete by:  As directed   Drink plenty of fluids.  Prune juice may be helpful.  You may use a stool softener, such as Colace (over the counter) 100 mg twice a day.  Use MiraLax (over the counter) for constipation as needed.     Diet - low sodium heart healthy    Complete by:  As directed       Diet Carb Modified    Complete by:  As directed      Discharge instructions    Complete by:  As directed   Pick up stool softner and laxative for home use following surgery while on pain medications. Do not submerge incision under water. Please use good hand washing techniques while changing dressing each day. May shower starting three days after surgery. Please use a clean towel to pat the incision dry following showers. Continue to use ice for pain and swelling after surgery. Do not use any lotions or creams on the incision until instructed by your surgeon.  Take Xarelto for two and a half more weeks, then discontinue Xarelto. Once the patient has completed the blood thinner regimen, then take a Baby 81 mg Aspirin daily for three more weeks.  Postoperative Constipation Protocol  Constipation - defined medically as fewer than three stools per week and severe constipation as less than one stool per week.  One of the most common issues patients have following surgery is constipation.  Even if you have a regular bowel pattern at home, your normal regimen is likely to be disrupted due to multiple reasons following surgery.  Combination of anesthesia, postoperative narcotics, change in appetite and fluid intake all can affect your bowels.  In order to avoid complications following surgery, here are some recommendations in order to help you during your recovery period.  Colace (docusate) - Pick up an over-the-counter form of Colace or another stool softener and take twice a day as long as you are requiring postoperative pain medications.  Take with a full glass of water daily.  If you experience loose stools or diarrhea, hold the colace until you stool forms back up.  If your symptoms do not get better within 1 week or if they get worse, check with your doctor.  Dulcolax (bisacodyl) - Pick up over-the-counter and take as directed by the product packaging as needed to assist with the movement of your  bowels.  Take with a full glass of water.  Use this product as needed if not relieved by Colace only.   MiraLax (polyethylene glycol) - Pick up over-the-counter to have on hand.  MiraLax is a solution that will increase the amount of water in your bowels to assist with bowel movements.  Take as directed and can mix with a glass of water, juice, soda, coffee, or tea.  Take if you go more than two days without a movement. Do not use MiraLax more than once per day. Call your doctor if you are still constipated or irregular after using this medication for 7 days in a row.  If you continue to have problems with postoperative constipation, please contact the office for further assistance and recommendations.  If you experience "the worst abdominal pain ever" or develop nausea or vomiting, please contact the office immediatly for further recommendations for treatment.     Do not put a pillow under the knee. Place it under the  heel.    Complete by:  As directed      Do not sit on low chairs, stoools or toilet seats, as it may be difficult to get up from low surfaces    Complete by:  As directed      Driving restrictions    Complete by:  As directed   No driving until released by the physician.     Increase activity slowly as tolerated    Complete by:  As directed      Lifting restrictions    Complete by:  As directed   No lifting until released by the physician.     Patient may shower    Complete by:  As directed   You may shower without a dressing once there is no drainage.  Do not wash over the wound.  If drainage remains, do not shower until drainage stops.     TED hose    Complete by:  As directed   Use stockings (TED hose) for 3 weeks on both leg(s).  You may remove them at night for sleeping.     Weight bearing as tolerated    Complete by:  As directed   Laterality:  right  Extremity:  Lower            Medication List    STOP taking these medications        CALCIUM 500 + D PO      estradiol 1 MG tablet  Commonly known as:  ESTRACE     medroxyPROGESTERone 2.5 MG tablet  Commonly known as:  PROVERA     mometasone-formoterol 100-5 MCG/ACT Aero  Commonly known as:  DULERA     multivitamin tablet     multivitamin-lutein Caps capsule      TAKE these medications        ALPRAZolam 0.25 MG tablet  Commonly known as:  XANAX  Take 0.25 mg by mouth daily as needed for anxiety.     benzonatate 200 MG capsule  Commonly known as:  TESSALON  Take 1 capsule (200 mg total) by mouth 3 (three) times daily as needed for cough.     esomeprazole 40 MG packet  Commonly known as:  NEXIUM  Take 40 mg by mouth daily before breakfast.     gabapentin 100 MG capsule  Commonly known as:  NEURONTIN  Take 1 capsule (100 mg total) by mouth 3 (three) times daily.     halobetasol 0.05 % cream  Commonly known as:  ULTRAVATE  Apply 1 application topically 2 (two) times daily as needed (psoriasis.).     hydrochlorothiazide 50 MG tablet  Commonly known as:  HYDRODIURIL  Take 25 mg by mouth every morning.     HYDROcodone-acetaminophen 5-325 MG tablet  Commonly known as:  NORCO/VICODIN  Take 1-2 tablets by mouth every 4 (four) hours as needed for moderate pain.     methocarbamol 500 MG tablet  Commonly known as:  ROBAXIN  Take 1 tablet (500 mg total) by mouth every 6 (six) hours as needed for muscle spasms.     omeprazole 20 MG tablet  Commonly known as:  PRILOSEC OTC  Take 20 mg by mouth every morning.     rivaroxaban 10 MG Tabs tablet  Commonly known as:  XARELTO  Take 1 tablet (10 mg total) by mouth daily with breakfast. Take Xarelto for two and a half more weeks, then discontinue Xarelto. Once the patient has completed the blood thinner regimen, then take  a Baby 81 mg Aspirin daily for three more weeks.     traMADol 50 MG tablet  Commonly known as:  ULTRAM  Take 1-2 tablets (50-100 mg total) by mouth every 6 (six) hours as needed (mild pain).     TYLENOL 8 HOUR 650 MG CR  tablet  Generic drug:  acetaminophen  Take 650 mg by mouth every 8 (eight) hours as needed for pain.           Follow-up Information    Follow up with [facility].   Specialty:  Home Health Services   Why:  home health physical therapy   Contact information:   [address] [place] [address] [phone]       Follow up with [facility].   Why:  3n1   Contact information:   [address] [place] [address] [phone]       Follow up with Gearlean Alf, MD. Schedule an appointment as soon as possible for a visit on 10/05/2015.   Specialty:  Orthopedic Surgery   Why:  Call office at [phone] to setup appointment with Dr. Wynelle Link on Tuesday 10/05/2015.   Contact information:   [address] [place] [address] [phone]       Signed: Arlee Muslim, PA-C Orthopaedic Surgery 09/21/2015, 10:12 PM

## 2015-09-21 NOTE — Care Management Note (Signed)
Case Management Note  Patient Details  Name: Michaela Wood MRN: [id number] Date of Birth: [birth year]  Subjective/Objective:                   TOTAL RIGHT KNEE ARTHROPLASTY (Right) Action/Plan:  Discharge planning Expected Discharge Date:  09/22/15               Expected Discharge Plan:  [place]  In-House Referral:     Discharge planning Services  CM Consult  Post Acute Care Choice:  Home Health Choice offered to:  Patient  DME Arranged:  3-N-1 DME Agency:  [facility]:  PT [facility] Agency:  [facility]  Status of Service:  Completed, signed off  Medicare Important Message Given:    Date Medicare IM Given:    Medicare IM give by:    Date Additional Medicare IM Given:    Additional Medicare Important Message give by:     If discussed at [place] of Stay Meetings, dates discussed:    Additional Comments: CM met with pt in room to offer choice; choice is limited as pt has [place] for insurance.  Pt states [facility] will be fine.  Referral called to [facility] rep, Nelida Mandarino who requests I fax facesheet, H&P, OP note, Progress note, PT EVAL note, Order, and F2F.  CM faxed requested information to [phone] and received confirmation of receipt.  Cm called [facility] DME rep, Lecretia to please deliver the 3n1 to room prior to discharge.  No other CM needs were communicated. Dellie Catholic, RN 09/21/2015, 1:26 PM

## 2015-09-21 NOTE — Progress Notes (Signed)
   Subjective: 1 Day Post-Op Procedure(s) (LRB): TOTAL RIGHT KNEE ARTHROPLASTY (Right) Patient reports pain as mild.   Patient seen in rounds with Dr. Lequita HaltAluisio.  Nauseated last night.  Believed to be the narcotic pain pill. Will switch pain medication today. Patient is well, but has had some minor complaints of pain in the knee, requiring pain medications and nausea We will resume therapy today. She walked 40 feet the day of surgery. Plan is to go Home after hospital stay.  Objective: Vital signs in last 24 hours: Temp:  [96.8 F (36 C)-98.7 F (37.1 C)] 98.6 F (37 C) (10/18 0500) Pulse Rate:  [69-108] 108 (10/18 0500) Resp:  [15-19] 16 (10/18 0500) BP: (104-146)/(55-86) 135/69 mmHg (10/18 0500) SpO2:  [99 %-100 %] 100 % (10/18 0500) Weight:  [46.04 kg (101 lb 8 oz)] 46.04 kg (101 lb 8 oz) (10/17 1117)  Intake/Output from previous day:  Intake/Output Summary (Last 24 hours) at 09/21/15 0853 Last data filed at 09/21/15 0600  Gross per 24 hour  Intake 4057.5 ml  Output    730 ml  Net 3327.5 ml    Intake/Output this shift: UOP 300 since MN  Labs:  Recent Labs  09/21/15 0545  HGB 11.3*    Recent Labs  09/21/15 0545  WBC 13.9*  RBC 3.62*  HCT 33.2*  PLT 276    Recent Labs  09/20/15 0650 09/21/15 0545  NA 139 139  K 3.9 3.6  CL 104 108  CO2 26 25  BUN 16 14  CREATININE 0.71 0.72  GLUCOSE 136* 138*  CALCIUM 9.2 8.6*   No results for input(s): LABPT, INR in the last 72 hours.  EXAM General - Patient is Alert, Appropriate and Oriented Extremity - Neurovascular intact Sensation intact distally Dorsiflexion/Plantar flexion intact Dressing - dressing C/D/I Motor Function - intact, moving foot and toes well on exam.  Hemovac pulled without difficulty.  Past Medical History  Diagnosis Date  . PONV (postoperative nausea and vomiting)     only with wisdom teeth extraction  . Hypertension   . Abnormal EKG     "told bundle branch blockage"-EKG brought in  09-11-07 copy with chart  . Varicose veins 11-04-13    oral varicose veins-surgically excised  . Arthritis   . Bruises easily     hx. of. Fell" knee gave way" Right cheek with recent suture removal  . Varicose veins 11-04-13    bilateral legs-no problems-09-15-15 remains not a problem  . Diabetes mellitus without complication ([facility])     recently told- no meds as of yet  . GERD (gastroesophageal reflux disease)     Assessment/Plan: 1 Day Post-Op Procedure(s) (LRB): TOTAL RIGHT KNEE ARTHROPLASTY (Right) Principal Problem:   OA (osteoarthritis) of knee  Estimated body mass index is 19.49 kg/(m^2) as calculated from the following:   Height as of this encounter: 5' 0.5" (1.537 m).   Weight as of this encounter: 46.04 kg (101 lb 8 oz). Advance diet Up with therapy Plan for discharge tomorrow Discharge home with home health   Due to the nausea, will DC the Dilaudid and try Norco (hydrocodone).  She has a codeine intolerance listed (nausea) but it was true codeine and she has tried Norco/Vicodin before.  DVT Prophylaxis - Xarelto Weight-Bearing as tolerated to right leg D/C O2 and Pulse OX and try on Room Air  Michaela Peacerew Sherree Shankman, PA-C Orthopaedic Surgery 09/21/2015, 8:53 AM

## 2015-09-21 NOTE — Evaluation (Signed)
Occupational Therapy Evaluation Patient Details Name: Michaela Wood MRN: [id number] DOB: 07/11/1956 Today's Date: 09/21/2015    History of Present Illness Pt is a 59 year old female s/p R TKA.   Clinical Impression   Pt up to the bathroom to practice on and off 3in1 with walker use. Pt needs frequent verbal cues initially to not pick up the walker and for proper hand placement--she did better as session progressed. Discussed use of 3in1 for shower chair and to have handles to help with toilet transfers. She will benefit from continued OT services to progress ADL independence and safety.    Follow Up Recommendations  No OT follow up;Supervision/Assistance - 24 hour    Equipment Recommendations  3 in 1 bedside comode    Recommendations for Other Services       Precautions / Restrictions Precautions Precautions: Knee Required Braces or Orthoses: Knee Immobilizer - Right Knee Immobilizer - Right: Discontinue once straight leg raise with < 10 degree lag Restrictions Weight Bearing Restrictions: No Other Position/Activity Restrictions: WBAT      Mobility Bed Mobility Overal bed mobility: Needs Assistance Bed Mobility: Supine to Sit;Sit to Supine     Supine to sit: Min guard Sit to supine: Min assist   General bed mobility comments: cues for safety.  Transfers Overall transfer level: Needs assistance Equipment used: Rolling walker (2 wheeled) Transfers: Sit to/from Stand Sit to Stand: Min assist         General transfer comment: cues for hand placement and LE management.    Balance                                            ADL Overall ADL's : Needs assistance/impaired Eating/Feeding: Independent;Sitting   Grooming: Wash/dry hands;Set up;Sitting   Upper Body Bathing: Set up;Sitting   Lower Body Bathing: Minimal assistance;Sit to/from stand   Upper Body Dressing : Set up;Sitting   Lower Body Dressing: Moderate assistance;Sit to/from  stand   Toilet Transfer: Minimal assistance;Ambulation;RW   Toileting- Clothing Manipulation and Hygiene: Minimal assistance;Sit to/from stand         General ADL Comments: Explained briefly AE options and pt states she would like to see them next session. She states husband can also assist with LB dressing. Pt needs min cues for not picking up the walker and for hand placement with functional transfers. Pt needed assist to start underwear over LEs but then pt able to help with standing and pulling up underwear. Reviewed sequence for LB dressing.      Vision     Perception     Praxis      Pertinent Vitals/Pain Pain Assessment: 0-10 Pain Score: 5  Pain Location: R knee Pain Descriptors / Indicators: Sore Pain Intervention(s): Repositioned;Ice applied     Hand Dominance     Extremity/Trunk Assessment Upper Extremity Assessment Upper Extremity Assessment: Overall WFL for tasks assessed           Communication Communication Communication: No difficulties   Cognition Arousal/Alertness: Awake/alert Behavior During Therapy: WFL for tasks assessed/performed Overall Cognitive Status: Within Functional Limits for tasks assessed                     General Comments       Exercises       Shoulder Instructions      Home Living Family/patient expects to  be discharged to:: Private residence Living Arrangements: Spouse/significant other   Type of Home: House Home Access: Stairs to enter Secretary/administrator of Steps: 3 Entrance Stairs-Rails: None Home Layout: One level     Bathroom Shower/Tub: Producer, television/film/video: Handicapped height     Home Equipment: Environmental consultant - 2 wheels;Shower seat - built in          Prior Functioning/Environment Level of Independence: Independent             OT Diagnosis: Generalized weakness   OT Problem List: Decreased strength;Decreased knowledge of use of DME or AE   OT Treatment/Interventions:  Self-care/ADL training;Patient/family education;Therapeutic activities;DME and/or AE instruction    OT Goals(Current goals can be found in the care plan section) Acute Rehab OT Goals Patient Stated Goal: return to independence. OT Goal Formulation: With patient Time For Goal Achievement: 09/28/15 Potential to Achieve Goals: Good  OT Frequency: Min 2X/week   Barriers to D/C:            Co-evaluation              End of Session Equipment Utilized During Treatment: Rolling walker;Right knee immobilizer  Activity Tolerance: Patient tolerated treatment well Patient left: in bed;with call bell/phone within reach   Time: 1610-9604 OT Time Calculation (min): 29 min Charges:  OT General Charges $OT Visit: 1 Procedure OT Evaluation $Initial OT Evaluation Tier I: 1 Procedure OT Treatments $Therapeutic Activity: 8-22 mins G-Codes:    Lennox Laity  [phone] 09/21/2015, 11:34 AM

## 2015-09-22 LAB — CBC
HCT: 30.4 % — ABNORMAL LOW (ref 36.0–46.0)
Hemoglobin: 10.2 g/dL — ABNORMAL LOW (ref 12.0–15.0)
MCH: 31.2 pg (ref 26.0–34.0)
MCHC: 33.6 g/dL (ref 30.0–36.0)
MCV: 93 fL (ref 78.0–100.0)
PLATELETS: 247 10*3/uL (ref 150–400)
RBC: 3.27 MIL/uL — AB (ref 3.87–5.11)
RDW: 13.3 % (ref 11.5–15.5)
WBC: 12.9 10*3/uL — AB (ref 4.0–10.5)

## 2015-09-22 LAB — BASIC METABOLIC PANEL
ANION GAP: 7 (ref 5–15)
BUN: 19 mg/dL (ref 6–20)
CALCIUM: 8.8 mg/dL — AB (ref 8.9–10.3)
CO2: 29 mmol/L (ref 22–32)
Chloride: 103 mmol/L (ref 101–111)
Creatinine, Ser: 0.75 mg/dL (ref 0.44–1.00)
Glucose, Bld: 139 mg/dL — ABNORMAL HIGH (ref 65–99)
Potassium: 3.3 mmol/L — ABNORMAL LOW (ref 3.5–5.1)
Sodium: 139 mmol/L (ref 135–145)

## 2015-09-22 MED ORDER — POTASSIUM CHLORIDE CRYS ER 20 MEQ PO TBCR
40.0000 meq | EXTENDED_RELEASE_TABLET | ORAL | Status: AC
  Administered 2015-09-22 (×2): 40 meq via ORAL
  Filled 2015-09-22 (×2): qty 2

## 2015-09-22 NOTE — Progress Notes (Signed)
Physical Therapy Treatment Patient Details Name: Michaela Wood MRN: [id number] DOB: 07/26/1956 Today's Date: 09/22/2015    History of Present Illness Pt is a 59 year old female s/p R TKA.    PT Comments    POD # 2 am session.  Pt dressed and eager to D/C to home.  Instructed spouse on proper application of KI.  Had spouse assist pt out of recliner and amb in hallway.  Practiced stairs twice.  Instructed on proper sequencing and safety.   Follow Up Recommendations  Home health PT     Equipment Recommendations  None recommended by PT    Recommendations for Other Services       Precautions / Restrictions Precautions Precautions: Knee Precaution Comments: instructed on KI use for amb and stairs Required Braces or Orthoses: Knee Immobilizer - Right Knee Immobilizer - Right: Discontinue once straight leg raise with < 10 degree lag Restrictions Weight Bearing Restrictions: No RLE Weight Bearing: Weight bearing as tolerated    Mobility  Bed Mobility Overal bed mobility: Needs Assistance Bed Mobility: Supine to Sit     Supine to sit: Supervision     General bed mobility comments: Pt OOB in recliner  Transfers Overall transfer level: Needs assistance Equipment used: Rolling walker (2 wheeled) Transfers: Sit to/from Stand Sit to Stand: Supervision         General transfer comment: increased time  Ambulation/Gait Ambulation/Gait assistance: Supervision   Assistive device: Rolling walker (2 wheeled) Gait Pattern/deviations: Step-to pattern;Step-through pattern Gait velocity: WFL   General Gait Details: one VC on safety with turns   Stairs Stairs: Yes Stairs assistance: Min assist Stair Management: No rails;Step to pattern;Backwards;With walker Number of Stairs: 3 General stair comments: with spouse practiced stairs twice.  50% VC's on proper tech and advised to wear KI for increased support.   Wheelchair Mobility    Modified Rankin (Stroke Patients  Only)       Balance                                    Cognition Arousal/Alertness: Awake/alert Behavior During Therapy: WFL for tasks assessed/performed Overall Cognitive Status: Within Functional Limits for tasks assessed                      Exercises      General Comments        Pertinent Vitals/Pain Pain Assessment: 0-10 Pain Score: 3  Pain Location: R knee Pain Descriptors / Indicators: Sore Pain Intervention(s): Monitored during session;Premedicated before session;Repositioned;Ice applied    Home Living                      Prior Function            PT Goals (current goals can now be found in the care plan section) Progress towards PT goals: Progressing toward goals    Frequency  7X/week    PT Plan Current plan remains appropriate    Co-evaluation             End of Session Equipment Utilized During Treatment: Gait belt;Right knee immobilizer Activity Tolerance: Patient tolerated treatment well Patient left: in chair;with family/visitor present;with call bell/phone within reach     Time: 1055-1120 PT Time Calculation (min) (ACUTE ONLY): 25 min  Charges:  $Gait Training: 8-22 mins $Therapeutic Activity: 8-22 mins  G Codes:      Rica Koyanagi  PTA [facility]  Acute  Rehab Pager      [phone]

## 2015-09-22 NOTE — Progress Notes (Signed)
   Subjective: 2 Days Post-Op Procedure(s) (LRB): TOTAL RIGHT KNEE ARTHROPLASTY (Right) Patient reports pain as mild.   Patient seen in rounds with Dr. Lequita HaltAluisio. Patient is well, and has had no acute complaints or problems Patient is ready to go home  Objective: Vital signs in last 24 hours: Temp:  [98 F (36.7 C)-99 F (37.2 C)] 98 F (36.7 C) (10/19 0445) Pulse Rate:  [88-102] 90 (10/19 0445) Resp:  [15-16] 16 (10/19 0445) BP: (108-140)/(64-82) 140/82 mmHg (10/19 0445) SpO2:  [98 %-100 %] 98 % (10/19 0445)  Intake/Output from previous day:  Intake/Output Summary (Last 24 hours) at 09/22/15 1024 Last data filed at 09/22/15 0829  Gross per 24 hour  Intake 1131.08 ml  Output   1200 ml  Net -68.92 ml    Intake/Output this shift: Total I/O In: 240 [P.O.:240] Out: -   Labs:  Recent Labs  09/21/15 0545 09/22/15 0425  HGB 11.3* 10.2*    Recent Labs  09/21/15 0545 09/22/15 0425  WBC 13.9* 12.9*  RBC 3.62* 3.27*  HCT 33.2* 30.4*  PLT 276 247    Recent Labs  09/21/15 0545 09/22/15 0425  NA 139 139  K 3.6 3.3*  CL 108 103  CO2 25 29  BUN 14 19  CREATININE 0.72 0.75  GLUCOSE 138* 139*  CALCIUM 8.6* 8.8*   No results for input(s): LABPT, INR in the last 72 hours.  EXAM: General - Patient is Alert and Appropriate Extremity - Neurovascular intact Sensation intact distally Incision - clean, dry, no drainage Motor Function - intact, moving foot and toes well on exam.   Assessment/Plan: 2 Days Post-Op Procedure(s) (LRB): TOTAL RIGHT KNEE ARTHROPLASTY (Right) Procedure(s) (LRB): TOTAL RIGHT KNEE ARTHROPLASTY (Right) Past Medical History  Diagnosis Date  . PONV (postoperative nausea and vomiting)     only with wisdom teeth extraction  . Hypertension   . Abnormal EKG     "told bundle branch blockage"-EKG brought in 09-11-07 copy with chart  . Varicose veins 11-04-13    oral varicose veins-surgically excised  . Arthritis   . Bruises easily     hx.  of. Fell" knee gave way" Right cheek with recent suture removal  . Varicose veins 11-04-13    bilateral legs-no problems-09-15-15 remains not a problem  . Diabetes mellitus without complication (HCC)     recently told- no meds as of yet  . GERD (gastroesophageal reflux disease)    Principal Problem:   OA (osteoarthritis) of knee  Estimated body mass index is 19.49 kg/(m^2) as calculated from the following:   Height as of this encounter: 5' 0.5" (1.537 m).   Weight as of this encounter: 46.04 kg (101 lb 8 oz). Up with therapy Discharge home with home health Diet - Cardiac diet Follow up - in 2 weeks Activity - WBAT Disposition - Home Condition Upon Discharge - Good D/C Meds - See DC Summary DVT Prophylaxis - Xarelto  Michaela Peacerew Olivene Cookston, PA-C Orthopaedic Surgery 09/22/2015, 10:24 AM

## 2015-09-22 NOTE — Progress Notes (Signed)
CM received a call from [facility] and, unfortunately, they cannot render HHPT for pt as they do not have enough staffing.  [facility].  CM spoke with Leotis ShamesLauren who requested I fax facesheet, H&P, OP note, EVAL notes, Order, and F2F to [phone]. CM received confirmation of receipt. CM explained to pt her choices are limited by the acceptance of her insurance; pt [facility] health [facility].  No other CM needs were communicated.

## 2015-09-22 NOTE — Progress Notes (Signed)
Occupational Therapy Treatment Patient Details Name: Michaela Wood MRN: [id number] DOB: 11/08/1956 Today's Date: 09/22/2015    History of present illness Pt is a 59 year old female s/p R TKA.   OT comments  Pt doing well. Education with spouse and pt completed for d/c home today. Husband available to assist PRN at home. Issued shower transfer handout and reviewed shower transfer sequence/safety.   Follow Up Recommendations  No OT follow up;Supervision/Assistance - 24 hour    Equipment Recommendations  3 in 1 bedside comode    Recommendations for Other Services      Precautions / Restrictions Precautions Precautions: Knee Required Braces or Orthoses: Knee Immobilizer - Right Knee Immobilizer - Right: Discontinue once straight leg raise with < 10 degree lag Restrictions Weight Bearing Restrictions: Yes RLE Weight Bearing: Weight bearing as tolerated       Mobility Bed Mobility Overal bed mobility: Needs Assistance Bed Mobility: Supine to Sit     Supine to sit: Supervision     General bed mobility comments: supervision for safety.  Transfers Overall transfer level: Needs assistance Equipment used: Rolling walker (2 wheeled) Transfers: Sit to/from Stand Sit to Stand: Supervision         General transfer comment: pt did well with hand placement today.    Balance                                   ADL       Grooming: Wash/dry hands;Standing;Min Doctor, hospitalguard                   Toilet Transfer: Min guard;Ambulation;BSC;RW   Toileting- ArchitectClothing Manipulation and Hygiene: Min guard;Sit to/from stand   Tub/ Shower Transfer: Walk-in shower;Minimal assistance;Rolling walker     General ADL Comments: Husband present for session. reviewed use of KI and when to wear as well as how to don/doff. Pt did don underwear and shorts today and demonstrated use of reacher if desired to help with donning shorts over R LE. Practiced step over shower and husband  assisted with steadying walker. Issued shower transfer handout and reviewed sequence for shower transfer again. emphasized need for wearing KI to step in and out of shower versus sponge bathe and see if she can do SLR soon and not have to wear KI to get in shower. Pt and husband verbalized understanding of all education. Discussed use of 3in1 as shower chair also.       Vision                     Perception     Praxis      Cognition   Behavior During Therapy: WFL for tasks assessed/performed Overall Cognitive Status: Within Functional Limits for tasks assessed                       Extremity/Trunk Assessment               Exercises     Shoulder Instructions       General Comments      Pertinent Vitals/ Pain       Pain Assessment: 0-10 Pain Score: 2  Pain Descriptors / Indicators: Sore Pain Intervention(s): Repositioned;Monitored during session;Ice applied  Home Living  Prior Functioning/Environment              Frequency Min 2X/week     Progress Toward Goals  OT Goals(current goals can now be found in the care plan section)  Progress towards OT goals: Progressing toward goals     Plan Discharge plan remains appropriate    Co-evaluation                 End of Session Equipment Utilized During Treatment: Rolling walker;Right knee immobilizer   Activity Tolerance Patient tolerated treatment well   Patient Left in chair;with call bell/phone within reach;with family/visitor present   Nurse Communication          Time: 1610-9604 OT Time Calculation (min): 42 min  Charges: OT General Charges $OT Visit: 1 Procedure OT Treatments $Self Care/Home Management : 23-37 mins $Therapeutic Activity: 8-22 mins  Lennox Laity  [phone] 09/22/2015, 9:40 AM

## 2017-07-12 ENCOUNTER — Other Ambulatory Visit: Payer: Self-pay | Admitting: Obstetrics and Gynecology

## 2017-07-20 ENCOUNTER — Encounter (HOSPITAL_COMMUNITY)
Admission: RE | Admit: 2017-07-20 | Discharge: 2017-07-20 | Disposition: A | Discharge status: Home / Self Care | Payer: BLUE CROSS/BLUE SHIELD | Source: Ambulatory Visit | Attending: Obstetrics and Gynecology | Admitting: Obstetrics and Gynecology

## 2017-07-20 ENCOUNTER — Other Ambulatory Visit: Payer: Self-pay

## 2017-07-20 ENCOUNTER — Encounter (HOSPITAL_COMMUNITY): Payer: Self-pay

## 2017-07-20 DIAGNOSIS — Z01818 Encounter for other preprocedural examination: Secondary | ICD-10-CM | POA: Insufficient documentation

## 2017-07-20 LAB — BASIC METABOLIC PANEL
ANION GAP: 10 (ref 5–15)
BUN: 17 mg/dL (ref 6–20)
CHLORIDE: 99 mmol/L — AB (ref 101–111)
CO2: 31 mmol/L (ref 22–32)
Calcium: 9.5 mg/dL (ref 8.9–10.3)
Creatinine, Ser: 0.76 mg/dL (ref 0.44–1.00)
GFR calc Af Amer: >60 mL/min (ref >60)
GLUCOSE: 72 mg/dL (ref 65–99)
POTASSIUM: 3.1 mmol/L — AB (ref 3.5–5.1)
Sodium: 140 mmol/L (ref 135–145)

## 2017-07-20 LAB — CBC
HEMATOCRIT: 41.5 % (ref 36.0–46.0)
HEMOGLOBIN: 14.2 g/dL (ref 12.0–15.0)
MCH: 31.9 pg (ref 26.0–34.0)
MCHC: 34.2 g/dL (ref 30.0–36.0)
MCV: 93.3 fL (ref 78.0–100.0)
Platelets: 290 10*3/uL (ref 150–400)
RBC: 4.45 MIL/uL (ref 3.87–5.11)
RDW: 12.9 % (ref 11.5–15.5)
WBC: 6.9 10*3/uL (ref 4.0–10.5)

## 2017-07-20 NOTE — Patient Instructions (Signed)
Your procedure is scheduled on: Thursday August 02, 2017 at 8:00 am  Enter through the [employer] [facility] at: 6:30 am  Pick up the phone at the desk and dial [phone].  Call this number if you have problems the morning of surgery: [phone].  Remember: Do NOT eat food or drink any liquids after: Midnight on Wednesday August 29 Do NOT drink clear liquids after: Take these medicines the morning of surgery with a SIP OF WATER: Zantac, Lovastatin, Xanax if needed  Do NOT wear jewelry (body piercing), metal hair clips/bobby pins, make-up, or nail polish. Do NOT wear lotions, powders, or perfumes.  You may wear deoderant. Do NOT shave for 48 hours prior to surgery. Do NOT bring valuables to the hospital. Contacts, dentures, or bridgework may not be worn into surgery. Leave suitcase in car.  After surgery it may be brought to your room.  For patients admitted to the hospital, checkout time is 11:00 AM the day of discharge.

## 2017-07-20 NOTE — Pre-Procedure Instructions (Signed)
EKG VIEWED BY DR. Sandford Craze AND OKAY'D

## 2017-08-01 NOTE — H&P (Signed)
NAME:  Michaela Wood, Michaela Wood NO.:  [id number]  MEDICAL RECORD NO.:  [id number]  LOCATION:                                 FACILITY:  PHYSICIAN:  Lenoard Aden, M.D.     DATE OF BIRTH:  DATE OF ADMISSION: DATE OF DISCHARGE:                             HISTORY & PHYSICAL   CHIEF COMPLAINT:  Symptomatic pelvic prolapse with apical loss of support, cystocele, rectocele, perineal relaxation.  HISTORY OF PRESENT ILLNESS:  A 61 year old white female, G2, P2, with aforementioned indications for surgery.  She has allergies to CODEINE. Medications include Estrace, Provera, vitamin D, hydrochlorothiazide, lovastatin, and glyburide.  She is a nonsmoker, nondrinker.  She denies domestic or physical violence.  History of knee replacement in 2016.  MEDICAL PROBLEMS:  Do include: 1. Hypercholesterolemia. 2. Hypertension. 3. Pelvic relaxation as noted.  The patient desires to proceed with     surgery.  FAMILY HISTORY:  Remarkable for heart disease and diabetes.  PHYSICAL EXAMINATION:  GENERAL:  This is well-developed, well-nourished white female, in no acute distress. HEENT:  Normal. NECK:  Supple.  Full range of motion. LUNGS:  Clear. HEART:  Regular rate and rhythm. ABDOMEN:  Soft, nontender. PELVIC:  Reveals loss of apical support.  Grade 2 cystocele, grade 2 rectocele with perineal relaxation. EXTREMITIES:  No cords. NEUROLOGIC:  Nonfocal. SKIN:  Intact.  IMPRESSION:  Symptomatic uterine prolapse, pelvic relaxation, cystocele, rectocele, probable enterocele, and perineal relaxation.  PLAN:  Proceed with DaVinci assisted total laparoscopic hysterectomy, bilateral salpingectomy, uterosacral ligament suspension, enterocele repair, cystocele and rectocele repair, perineorrhaphy.  Risks of anesthesia, infection, bleeding, injury to surrounding organs, possible need for repair discussed.  Delayed versus immediate complications to include bowel and bladder injury  noted.  The patient acknowledges and wishes to proceed.     Lenoard Aden, M.D.     RJT/MEDQ  D:  08/01/2017  T:  08/01/2017  Job:  [id number]

## 2017-08-02 ENCOUNTER — Ambulatory Visit (HOSPITAL_COMMUNITY): Payer: BLUE CROSS/BLUE SHIELD | Admitting: Certified Registered Nurse Anesthetist

## 2017-08-02 ENCOUNTER — Encounter (HOSPITAL_COMMUNITY): Payer: Self-pay

## 2017-08-02 ENCOUNTER — Ambulatory Visit (HOSPITAL_COMMUNITY)
Admission: RE | Admit: 2017-08-02 | Discharge: 2017-08-03 | Disposition: A | Discharge status: Home / Self Care | Payer: BLUE CROSS/BLUE SHIELD | Source: Ambulatory Visit | Attending: Obstetrics and Gynecology | Admitting: Obstetrics and Gynecology

## 2017-08-02 ENCOUNTER — Encounter (HOSPITAL_COMMUNITY)
Admission: RE | Disposition: A | Discharge status: Home / Self Care | Payer: Self-pay | Source: Ambulatory Visit | Attending: Obstetrics and Gynecology

## 2017-08-02 DIAGNOSIS — Z79899 Other long term (current) drug therapy: Secondary | ICD-10-CM | POA: Diagnosis not present

## 2017-08-02 DIAGNOSIS — E7800 Pure hypercholesterolemia, unspecified: Secondary | ICD-10-CM | POA: Diagnosis not present

## 2017-08-02 DIAGNOSIS — N8110 Cystocele, unspecified: Secondary | ICD-10-CM | POA: Diagnosis present

## 2017-08-02 DIAGNOSIS — N736 Female pelvic peritoneal adhesions (postinfective): Secondary | ICD-10-CM | POA: Insufficient documentation

## 2017-08-02 DIAGNOSIS — Z885 Allergy status to narcotic agent status: Secondary | ICD-10-CM | POA: Insufficient documentation

## 2017-08-02 DIAGNOSIS — I10 Essential (primary) hypertension: Secondary | ICD-10-CM | POA: Insufficient documentation

## 2017-08-02 DIAGNOSIS — N800 Endometriosis of uterus: Secondary | ICD-10-CM | POA: Insufficient documentation

## 2017-08-02 DIAGNOSIS — N8189 Other female genital prolapse: Secondary | ICD-10-CM | POA: Insufficient documentation

## 2017-08-02 DIAGNOSIS — Z833 Family history of diabetes mellitus: Secondary | ICD-10-CM | POA: Insufficient documentation

## 2017-08-02 DIAGNOSIS — Z8249 Family history of ischemic heart disease and other diseases of the circulatory system: Secondary | ICD-10-CM | POA: Insufficient documentation

## 2017-08-02 DIAGNOSIS — E118 Type 2 diabetes mellitus with unspecified complications: Secondary | ICD-10-CM | POA: Diagnosis not present

## 2017-08-02 DIAGNOSIS — N8111 Cystocele, midline: Secondary | ICD-10-CM | POA: Diagnosis not present

## 2017-08-02 DIAGNOSIS — Z96659 Presence of unspecified artificial knee joint: Secondary | ICD-10-CM | POA: Insufficient documentation

## 2017-08-02 HISTORY — PX: ROBOTIC ASSISTED TOTAL HYSTERECTOMY WITH UTEROSACRAL LIGAMENT SUSPENSION: SHX6764

## 2017-08-02 HISTORY — PX: LYSIS OF ADHESION: SHX5961

## 2017-08-02 HISTORY — PX: ANTERIOR AND POSTERIOR REPAIR: SHX5121

## 2017-08-02 LAB — GLUCOSE, CAPILLARY
GLUCOSE-CAPILLARY: 143 mg/dL — AB (ref 65–99)
Glucose-Capillary: 110 mg/dL — ABNORMAL HIGH (ref 65–99)

## 2017-08-02 SURGERY — ROBOTIC ASSISTED TOTAL HYSTERECTOMY WITH UTEROSACRAL LIGAMENT SUSPENSION
Anesthesia: General | Site: Vagina

## 2017-08-02 MED ORDER — MEPERIDINE HCL 25 MG/ML IJ SOLN: 6.2500 mg | INTRAMUSCULAR | Status: DC | PRN

## 2017-08-02 MED ORDER — ROCURONIUM BROMIDE 100 MG/10ML IV SOLN
INTRAVENOUS | Status: DC | PRN
  Administered 2017-08-02: 5 mg via INTRAVENOUS
  Administered 2017-08-02: 40 mg via INTRAVENOUS

## 2017-08-02 MED ORDER — ROPIVACAINE HCL 5 MG/ML IJ SOLN
INTRAMUSCULAR | Status: DC | PRN
  Administered 2017-08-02: 55 mL

## 2017-08-02 MED ORDER — LIDOCAINE HCL (CARDIAC) 20 MG/ML IV SOLN
INTRAVENOUS | Status: DC | PRN
  Administered 2017-08-02: 40 mg via INTRAVENOUS

## 2017-08-02 MED ORDER — SUGAMMADEX SODIUM 200 MG/2ML IV SOLN
INTRAVENOUS | Status: AC
  Filled 2017-08-02: qty 2

## 2017-08-02 MED ORDER — SCOPOLAMINE 1 MG/3DAYS TD PT72
1.0000 | MEDICATED_PATCH | Freq: Once | TRANSDERMAL | Status: DC
  Administered 2017-08-02: 1.5 mg via TRANSDERMAL

## 2017-08-02 MED ORDER — SUGAMMADEX SODIUM 200 MG/2ML IV SOLN
INTRAVENOUS | Status: DC | PRN
  Administered 2017-08-02: 100 mg via INTRAVENOUS

## 2017-08-02 MED ORDER — PROPOFOL 10 MG/ML IV BOLUS
INTRAVENOUS | Status: AC
  Filled 2017-08-02: qty 20

## 2017-08-02 MED ORDER — HYDROCHLOROTHIAZIDE 25 MG PO TABS
25.0000 mg | ORAL_TABLET | Freq: Every morning | ORAL | Status: DC
  Administered 2017-08-03: 25 mg via ORAL
  Filled 2017-08-02: qty 1

## 2017-08-02 MED ORDER — DIPHENHYDRAMINE HCL 50 MG/ML IJ SOLN: 12.5000 mg | Freq: Four times a day (QID) | INTRAMUSCULAR | Status: DC | PRN

## 2017-08-02 MED ORDER — METOCLOPRAMIDE HCL 5 MG/ML IJ SOLN: 10.0000 mg | Freq: Once | INTRAMUSCULAR | Status: DC | PRN

## 2017-08-02 MED ORDER — PROPOFOL 10 MG/ML IV BOLUS
INTRAVENOUS | Status: DC | PRN
  Administered 2017-08-02: 110 mg via INTRAVENOUS

## 2017-08-02 MED ORDER — DEXAMETHASONE SODIUM PHOSPHATE 4 MG/ML IJ SOLN
INTRAMUSCULAR | Status: AC
  Filled 2017-08-02: qty 1

## 2017-08-02 MED ORDER — CEFAZOLIN SODIUM-DEXTROSE 2-4 GM/100ML-% IV SOLN
INTRAVENOUS | Status: AC
Start: 2017-08-02 — End: 2017-08-02
  Filled 2017-08-02: qty 100

## 2017-08-02 MED ORDER — TRAMADOL HCL 50 MG PO TABS: 50.0000 mg | ORAL_TABLET | Freq: Four times a day (QID) | ORAL | Status: DC | PRN

## 2017-08-02 MED ORDER — FENTANYL CITRATE (PF) 250 MCG/5ML IJ SOLN
INTRAMUSCULAR | Status: AC
Start: 2017-08-02 — End: ?
  Filled 2017-08-02: qty 5

## 2017-08-02 MED ORDER — DIPHENHYDRAMINE HCL 12.5 MG/5ML PO ELIX: 12.5000 mg | ORAL_SOLUTION | Freq: Four times a day (QID) | ORAL | Status: DC | PRN

## 2017-08-02 MED ORDER — ONDANSETRON HCL 4 MG/2ML IJ SOLN
INTRAMUSCULAR | Status: DC | PRN
Start: 2017-08-02 — End: 2017-08-02
  Administered 2017-08-02: 4 mg via INTRAVENOUS

## 2017-08-02 MED ORDER — ROPIVACAINE HCL 5 MG/ML IJ SOLN
INTRAMUSCULAR | Status: AC
  Filled 2017-08-02: qty 30

## 2017-08-02 MED ORDER — BUPIVACAINE HCL (PF) 0.25 % IJ SOLN
INTRAMUSCULAR | Status: DC | PRN
  Administered 2017-08-02: 20 mL

## 2017-08-02 MED ORDER — MIDAZOLAM HCL 2 MG/2ML IJ SOLN
INTRAMUSCULAR | Status: DC | PRN
  Administered 2017-08-02: 2 mg via INTRAVENOUS

## 2017-08-02 MED ORDER — MIDAZOLAM HCL 2 MG/2ML IJ SOLN
INTRAMUSCULAR | Status: AC
  Filled 2017-08-02: qty 2

## 2017-08-02 MED ORDER — SODIUM CHLORIDE 0.9 % IJ SOLN
INTRAMUSCULAR | Status: AC
  Filled 2017-08-02: qty 100

## 2017-08-02 MED ORDER — SODIUM CHLORIDE 0.9% FLUSH: 9.0000 mL | INTRAVENOUS | Status: DC | PRN

## 2017-08-02 MED ORDER — DEXAMETHASONE SODIUM PHOSPHATE 10 MG/ML IJ SOLN
INTRAMUSCULAR | Status: DC | PRN
  Administered 2017-08-02: 4 mg via INTRAVENOUS

## 2017-08-02 MED ORDER — LACTATED RINGERS IV SOLN
INTRAVENOUS | Status: DC
  Administered 2017-08-02: 23:00:00 via INTRAVENOUS

## 2017-08-02 MED ORDER — SODIUM CHLORIDE 0.9 % IJ SOLN
INTRAMUSCULAR | Status: DC | PRN
  Administered 2017-08-02: 10 mL

## 2017-08-02 MED ORDER — ONDANSETRON HCL 4 MG/2ML IJ SOLN
INTRAMUSCULAR | Status: AC
  Filled 2017-08-02: qty 2

## 2017-08-02 MED ORDER — LACTATED RINGERS IV SOLN
INTRAVENOUS | Status: DC
  Administered 2017-08-02: 125 mL/h via INTRAVENOUS
  Administered 2017-08-02: 11:00:00 via INTRAVENOUS

## 2017-08-02 MED ORDER — GLIMEPIRIDE 2 MG PO TABS
2.0000 mg | ORAL_TABLET | Freq: Every day | ORAL | Status: DC
  Administered 2017-08-03: 2 mg via ORAL
  Filled 2017-08-02 (×2): qty 1

## 2017-08-02 MED ORDER — SODIUM CHLORIDE 0.9 % IR SOLN
Status: DC | PRN
  Administered 2017-08-02: 3000 mL

## 2017-08-02 MED ORDER — LIDOCAINE HCL (CARDIAC) 20 MG/ML IV SOLN
INTRAVENOUS | Status: AC
  Filled 2017-08-02: qty 5

## 2017-08-02 MED ORDER — NALOXONE HCL 0.4 MG/ML IJ SOLN: 0.4000 mg | INTRAMUSCULAR | Status: DC | PRN

## 2017-08-02 MED ORDER — SCOPOLAMINE 1 MG/3DAYS TD PT72
MEDICATED_PATCH | TRANSDERMAL | Status: AC
  Administered 2017-08-02: 1.5 mg via TRANSDERMAL
  Filled 2017-08-02: qty 1

## 2017-08-02 MED ORDER — LACTATED RINGERS IV SOLN: INTRAVENOUS | Status: DC

## 2017-08-02 MED ORDER — KETOROLAC TROMETHAMINE 30 MG/ML IJ SOLN
INTRAMUSCULAR | Status: AC
  Filled 2017-08-02: qty 1

## 2017-08-02 MED ORDER — FENTANYL CITRATE (PF) 100 MCG/2ML IJ SOLN: 25.0000 ug | INTRAMUSCULAR | Status: DC | PRN

## 2017-08-02 MED ORDER — ESTRADIOL 0.1 MG/GM VA CREA
TOPICAL_CREAM | VAGINAL | Status: AC
  Filled 2017-08-02: qty 42.5

## 2017-08-02 MED ORDER — ESTRADIOL 1 MG PO TABS
1.0000 mg | ORAL_TABLET | Freq: Every day | ORAL | Status: DC
  Administered 2017-08-03: 1 mg via ORAL
  Filled 2017-08-02 (×3): qty 1

## 2017-08-02 MED ORDER — VASOPRESSIN 20 UNIT/ML IV SOLN
INTRAVENOUS | Status: AC
  Filled 2017-08-02: qty 1

## 2017-08-02 MED ORDER — BUPIVACAINE HCL (PF) 0.25 % IJ SOLN
INTRAMUSCULAR | Status: AC
  Filled 2017-08-02: qty 30

## 2017-08-02 MED ORDER — CEFAZOLIN SODIUM-DEXTROSE 2-4 GM/100ML-% IV SOLN
2.0000 g | INTRAVENOUS | Status: AC
  Administered 2017-08-02: 2 g via INTRAVENOUS

## 2017-08-02 MED ORDER — ONDANSETRON HCL 4 MG/2ML IJ SOLN
4.0000 mg | Freq: Four times a day (QID) | INTRAMUSCULAR | Status: DC | PRN
  Administered 2017-08-02: 4 mg via INTRAVENOUS
  Filled 2017-08-02: qty 2

## 2017-08-02 MED ORDER — ROCURONIUM BROMIDE 100 MG/10ML IV SOLN
INTRAVENOUS | Status: AC
  Filled 2017-08-02: qty 1

## 2017-08-02 MED ORDER — SODIUM CHLORIDE 0.9 % IV SOLN
INTRAVENOUS | Status: DC | PRN
  Administered 2017-08-02: 23 mL via INTRAMUSCULAR

## 2017-08-02 MED ORDER — FENTANYL CITRATE (PF) 100 MCG/2ML IJ SOLN
INTRAMUSCULAR | Status: DC | PRN
  Administered 2017-08-02: 100 ug via INTRAVENOUS
  Administered 2017-08-02 (×3): 50 ug via INTRAVENOUS

## 2017-08-02 MED ORDER — ARTIFICIAL TEARS OPHTHALMIC OINT
TOPICAL_OINTMENT | OPHTHALMIC | Status: AC
  Filled 2017-08-02: qty 3.5

## 2017-08-02 MED ORDER — FENTANYL 40 MCG/ML IV SOLN
INTRAVENOUS | Status: DC
  Administered 2017-08-02: 1000 ug via INTRAVENOUS
  Administered 2017-08-02: 20 ug via INTRAVENOUS
  Filled 2017-08-02: qty 25

## 2017-08-02 SURGICAL SUPPLY — 70 items
BARRIER ADHS 3X4 INTERCEED (GAUZE/BANDAGES/DRESSINGS) IMPLANT
CATH FOLEY 3WAY  5CC 16FR (CATHETERS) ×2
CATH FOLEY 3WAY 5CC 16FR (CATHETERS) ×3 IMPLANT
CLOTH BEACON ORANGE TIMEOUT ST (SAFETY) ×5 IMPLANT
CONT PATH 16OZ SNAP LID 3702 (MISCELLANEOUS) ×5 IMPLANT
COVER BACK TABLE 60X90IN (DRAPES) ×10 IMPLANT
COVER TIP SHEARS 8 DVNC (MISCELLANEOUS) ×3 IMPLANT
COVER TIP SHEARS 8MM DA VINCI (MISCELLANEOUS) ×2
DECANTER SPIKE VIAL GLASS SM (MISCELLANEOUS) ×20 IMPLANT
DEFOGGER SCOPE WARMER CLEARIFY (MISCELLANEOUS) ×5 IMPLANT
DERMABOND ADVANCED (GAUZE/BANDAGES/DRESSINGS) ×2
DERMABOND ADVANCED .7 DNX12 (GAUZE/BANDAGES/DRESSINGS) ×3 IMPLANT
DRSG OPSITE POSTOP 3X4 (GAUZE/BANDAGES/DRESSINGS) ×5 IMPLANT
DURAPREP 26ML APPLICATOR (WOUND CARE) ×5 IMPLANT
ELECT NEEDLE TIP 2.8 STRL (NEEDLE) IMPLANT
ELECT REM PT RETURN 9FT ADLT (ELECTROSURGICAL) ×5
ELECTRODE REM PT RTRN 9FT ADLT (ELECTROSURGICAL) ×3 IMPLANT
GAUZE PACKING 1 X5 YD ST (GAUZE/BANDAGES/DRESSINGS) IMPLANT
GAUZE PACKING 2X5 YD STRL (GAUZE/BANDAGES/DRESSINGS) IMPLANT
GAUZE VASELINE 3X9 (GAUZE/BANDAGES/DRESSINGS) IMPLANT
GLOVE BIO SURGEON STRL SZ7.5 (GLOVE) ×15 IMPLANT
GLOVE BIOGEL PI IND STRL 7.0 (GLOVE) ×6 IMPLANT
GLOVE BIOGEL PI INDICATOR 7.0 (GLOVE) ×4
GOWN STRL REUS W/TWL LRG LVL3 (GOWN DISPOSABLE) ×20 IMPLANT
HEMOSTAT SURGICEL 2X3 (HEMOSTASIS) ×5 IMPLANT
KIT ACCESSORY DA VINCI DISP (KITS) ×2
KIT ACCESSORY DVNC DISP (KITS) ×3 IMPLANT
LEGGING LITHOTOMY PAIR STRL (DRAPES) ×5 IMPLANT
NEEDLE HYPO 22GX1.5 SAFETY (NEEDLE) IMPLANT
NEEDLE INSUFFLATION 150MM (ENDOMECHANICALS) ×5 IMPLANT
NS IRRIG 1000ML POUR BTL (IV SOLUTION) ×5 IMPLANT
OCCLUDER COLPOPNEUMO (BALLOONS) ×5 IMPLANT
PACK ROBOT WH (CUSTOM PROCEDURE TRAY) ×5 IMPLANT
PACK ROBOTIC GOWN (GOWN DISPOSABLE) ×5 IMPLANT
PACK TRENDGUARD 450 HYBRID PRO (MISCELLANEOUS) ×3 IMPLANT
PACK TRENDGUARD 600 HYBRD PROC (MISCELLANEOUS) IMPLANT
PACK VAGINAL WOMENS (CUSTOM PROCEDURE TRAY) IMPLANT
PAD PREP 24X48 CUFFED NSTRL (MISCELLANEOUS) ×5 IMPLANT
PROTECTOR NERVE ULNAR (MISCELLANEOUS) ×10 IMPLANT
SET CYSTO W/LG BORE CLAMP LF (SET/KITS/TRAYS/PACK) IMPLANT
SET IRRIG TUBING LAPAROSCOPIC (IRRIGATION / IRRIGATOR) ×5 IMPLANT
SET TRI-LUMEN FLTR TB AIRSEAL (TUBING) ×5 IMPLANT
SUT MON AB 2-0 CT2 27 (SUTURE) ×20 IMPLANT
SUT VIC AB 0 CT1 27 (SUTURE) ×4
SUT VIC AB 0 CT1 27XBRD ANBCTR (SUTURE) ×6 IMPLANT
SUT VIC AB 2-0 CT1 27 (SUTURE)
SUT VIC AB 2-0 CT1 TAPERPNT 27 (SUTURE) IMPLANT
SUT VICRYL 0 UR6 27IN ABS (SUTURE) ×5 IMPLANT
SUT VICRYL 3 0 CT 3 (SUTURE) IMPLANT
SUT VICRYL RAPIDE 4/0 PS 2 (SUTURE) ×10 IMPLANT
SUT VLOC 180 0 9IN  GS21 (SUTURE) ×6
SUT VLOC 180 0 9IN GS21 (SUTURE) ×9 IMPLANT
SUT VLOC 180 3-0 9IN GS21 (SUTURE) ×5 IMPLANT
SUT VLOC NONABS 0 BL6 GS-21 (SUTURE) ×5 IMPLANT
SYR 50ML LL SCALE MARK (SYRINGE) ×5 IMPLANT
SYRINGE 10CC LL (SYRINGE) ×10 IMPLANT
TIP RUMI ORANGE 6.7MMX12CM (TIP) IMPLANT
TIP UTERINE 5.1X6CM LAV DISP (MISCELLANEOUS) IMPLANT
TIP UTERINE 6.7X10CM GRN DISP (MISCELLANEOUS) IMPLANT
TIP UTERINE 6.7X6CM WHT DISP (MISCELLANEOUS) IMPLANT
TIP UTERINE 6.7X8CM BLUE DISP (MISCELLANEOUS) ×5 IMPLANT
TOWEL OR 17X24 6PK STRL BLUE (TOWEL DISPOSABLE) ×15 IMPLANT
TRAY FOLEY CATH SILVER 14FR (SET/KITS/TRAYS/PACK) IMPLANT
TRENDGUARD 450 HYBRID PRO PACK (MISCELLANEOUS) ×5
TRENDGUARD 600 HYBRID PROC PK (MISCELLANEOUS)
TROCAR DISP BLADELESS 8 DVNC (TROCAR) ×3 IMPLANT
TROCAR DISP BLADELESS 8MM (TROCAR) ×2
TROCAR PORT AIRSEAL 5X120 (TROCAR) ×5 IMPLANT
TROCAR PORT AIRSEAL 8X120 (TROCAR) ×10 IMPLANT
TROCAR Z-THREAD 12X150 (TROCAR) ×5 IMPLANT

## 2017-08-02 NOTE — Anesthesia Preprocedure Evaluation (Addendum)
Anesthesia Evaluation  Patient identified by MRN, date of birth, ID band Patient awake    Reviewed: Allergy & Precautions, NPO status , Patient's Chart, lab work & pertinent test results  History of Anesthesia Complications (+) PONV  Airway Mallampati: II  TM Distance: >3 FB Neck ROM: Full    Dental no notable dental hx. (+) Partial Lower, Missing   Pulmonary neg pulmonary ROS,    Pulmonary exam normal breath sounds clear to auscultation       Cardiovascular hypertension, Pt. on medications Normal cardiovascular exam Rhythm:Regular Rate:Normal     Neuro/Psych negative neurological ROS  negative psych ROS   GI/Hepatic negative GI ROS, Neg liver ROS,   Endo/Other  diabetes, Type 2, Oral Hypoglycemic Agents  Renal/GU negative Renal ROS  negative genitourinary   Musculoskeletal negative musculoskeletal ROS (+)   Abdominal   Peds negative pediatric ROS (+)  Hematology negative hematology ROS (+)   Anesthesia Other Findings   Reproductive/Obstetrics negative OB ROS                            Anesthesia Physical Anesthesia Plan  ASA: II  Anesthesia Plan: General   Post-op Pain Management:    Induction: Intravenous  PONV Risk Score and Plan: 4 or greater and Ondansetron, Dexamethasone, Midazolam, Scopolamine patch - Pre-op and Treatment may vary due to age or medical condition  Airway Management Planned: Oral ETT  Additional Equipment:   Intra-op Plan:   Post-operative Plan: Extubation in OR  Informed Consent: I have reviewed the patients History and Physical, chart, labs and discussed the procedure including the risks, benefits and alternatives for the proposed anesthesia with the patient or authorized representative who has indicated his/her understanding and acceptance.   Dental advisory given  Plan Discussed with: CRNA  Anesthesia Plan Comments:         Anesthesia  Quick Evaluation

## 2017-08-02 NOTE — Op Note (Signed)
NAME:  Michaela Wood, BOHNSACK NO.:  [id number]  MEDICAL RECORD NO.:  [id number]  LOCATION:                                 FACILITY:  PHYSICIAN:  Lenoard Aden, M.D.     DATE OF BIRTH:  DATE OF PROCEDURE: DATE OF DISCHARGE:                              OPERATIVE REPORT   PREOPERATIVE DIAGNOSES: 1. Symptomatic pelvic relaxation. 2. Enterocele. 3. Cystocele. 4. Rectocele.  POSTOPERATIVE DIAGNOSES: 1. Symptomatic pelvic relaxation. 2. Enterocele. 3. Cystocele. 4. Rectocele. 5. Sigmoid adhesions, left adnexa.  PROCEDURES: 1. Robotic-assisted total laparoscopic hysterectomy. 2. Bilateral salpingectomy. 3. McCall culdoplasty. 4. Uterosacral ligament suspension. 5. Anterior colporrhaphy with Kelly plication. 6. Posterior repair. 7. Perineorrhaphy.  SURGEON:  Lenoard Aden, M.D.  ASSISTANT:  Fredric Mare.  ANESTHESIA:  General, local.  ESTIMATED BLOOD LOSS:  Less than 100 mL.  COMPLICATIONS:  None.  DRAINS:  Foley.  COUNTS:  Correct.  SPECIMEN:  Uterus, cervix, and bilateral tubes.  DISPOSITION:  The patient to recovery in good condition.  BRIEF OPERATIVE NOTE:  After being apprised of risks of anesthesia, infection, bleeding, injury to surrounding organs, possible need for repair, delayed versus immediate complications to include bowel and bladder injury, possible need for repair.  The patient was brought to the operating room.  She was administered general anesthetic without complications.  Prepped and draped in usual sterile fashion.  Foley catheter placed.  Exam under anesthesia reveals loss of apical support, anterior and posterior loss of support as well.  RUMI retractor placed vaginally without difficulty and attention turned to the abdominal portion of the procedure whereby an infraumbilical incision was made with a scalpel.  Veress needle placed, opening pressure -2, 3 liters of CO2 insufflated without difficulty.  Trocar placed  atraumatically, 2 trocar sites on the left and 1 trocar site on the right.  Visualization reveals normal liver, gallbladder area, normal diaphragm, normal pericardiac silhouette visualized through the diaphragm, normal bowel. No evidence of bowel injury on trocar entry.  Bilateral normal adnexa and pelvic relaxation as previously recognized.  At this time, the ureter was identified bilaterally.  The left tube was undermined along the mesosalpinx and divided.  The right tube cauterized along the mesosalpinx and divided.  The retroperitoneal space entered on the left. The ureter seen peristalsing along the medial leaf of the peritoneum. The tuboovarian ligament was identified, cauterized, skeletonized, and cut.  The round ligament was opened on the left and the uterine vessels were skeletonized on the left after developing the bladder flap sharply down to the cervicovaginal junction.  At this time, the uterine vessels on the left were cauterized using bipolar cautery and not cut.  On the right side, retroperitoneal space was entered.  Ureter identified on the medial leaf of the peritoneum.  The round ligament opened.  The tuboovarian ligament on the right was cauterized using bipolar cautery and cut.  The broad ligament was further developed on the level of the uterine vessels where these uterine vessels on the right are cauterized and cut.  The vessels on the left were cauterized again and cut. Bladder flap was completely developed.  The cervicovaginal junction identified, cauterized, and divided using  monopolar cautery.  The specimen retracted into the vagina, removed, balloon replaced.  At this time, vaginal cuff was closed using a 2 running layers of a 0 Vicryl V- Loc suture in a continuous running fashion.  McCall culdoplasty suture was placed.  Uterosacral ligament was then identified along the right approximately 3 to 4 cm distal from its attachments along the vagina.  A 0 V-Loc  suture was placed and the uterosacral ligament suspension was done in the standard fashion.  The same procedure done on the left. Ureters were seen peristalsing normally bilaterally during the entirety of both procedures.  The McCall culdoplasty suture was placed on the left.  Irrigation was accomplished.  A 0 V-Loc permanent suture was placed in the uterosacral ligaments on the right and the left. Reperitonealization was done using a 3-0 V-Loc suture.  Good hemostasis was noted.  Irrigation was accomplished.  There was a small area of serosal disruption along the posterior portion of the sigmoid colon. Minimal bleeding was noted.  No evidence of bowel injury was noted. Surgicel was placed for hemostasis along this serosal disruption.  At this time, good hemostasis noted.  CO2 was released and all trocars were removed under direct visualization.  Incisions were closed using 0 Vicryl, 4-0 Vicryl, and Dermabond.  Dilute Marcaine solution placed and ropivacaine solution placed intraabdominally.  At this time, attention was turned to the vaginal portion of the procedure where cystocele was identified, infiltrated using a dilute Pitressin solution.  Scalpel was used, making a linear incision in the pubovesical cervical fascia.  It is dissected sharply off the anterior vaginal mucosa.  The cystocele was reduced and imbricated using a 2-0 Vicryl suture.  Vagina was trimmed and closed using a 2-0 Vicryl suture.  In the posterior aspect, the rectocele was identified during a rectal exam.  Dilute Pitressin solution was placed.  A triangular portion of the perineum, perineal mucosa was excised.  The posterior vaginal wall was undermined.  The puborectal fascia was identified and dissected sharply off the posterior vaginal mucosa.  The rectocele was imbricated using a 2-0 Vicryl suture. The vagina was trimmed, closed using a 2-0 Vicryl suture.  Perineorrhaphy then performed in standard fashion using  a 2-0 Vicryl suture.  Good hemostasis noted.  Vaginal packing not needed.  Urine was clear and copious.  The patient tolerated the procedure well, was awakened and transferred to recovery room in good condition.     Lenoard Adenichard J. Calhoun Reichardt, M.D.     RJT/MEDQ  D:  08/02/2017  T:  08/02/2017  Job:  [id number]

## 2017-08-02 NOTE — Addendum Note (Signed)
Addendum  created 08/02/17 1601 by Renford DillsMullins, Toula Miyasaki L, CRNA   Sign clinical note

## 2017-08-02 NOTE — Transfer of Care (Signed)
Immediate Anesthesia Transfer of Care Note  Patient: Michaela Wood  Procedure(s) Performed: Procedure(s) with comments: ROBOTIC ASSISTED TOTAL HYSTERECTOMY/Bilateral Salpingectomy WITH UTEROSACRAL LIGAMENT SUSPENSION (Bilateral) - Requests 3 1/2 hrs. ANTERIOR (CYSTOCELE) AND POSTERIOR REPAIR (RECTOCELE)/Perineorrhaphy (N/A) LYSIS OF LEFT SIGMOID ADHESION (Left)  Patient Location: PACU  Anesthesia Type:General  Level of Consciousness: awake, alert  and confused  Airway & Oxygen Therapy: Patient Spontanous Breathing and Patient connected to nasal cannula oxygen  Post-op Assessment: Report given to RN, Post -op Vital signs reviewed and stable and Patient moving all extremities  Post vital signs: Reviewed and stable  Last Vitals:  Vitals:   08/02/17 0620  BP: 138/75  Pulse: 64  Resp: 16  Temp: (!) 36.4 C  SpO2: 100%    Last Pain:  Vitals:   08/02/17 0620  TempSrc: Oral      Patients Stated Pain Goal: 4 (08/02/17 [id number])  Complications: No apparent anesthesia complications

## 2017-08-02 NOTE — Anesthesia Procedure Notes (Signed)
Procedure Name: Intubation Date/Time: 08/02/2017 8:04 AM Performed by: Hewitt Blade Pre-anesthesia Checklist: Patient identified, Emergency Drugs available, Suction available and Patient being monitored Patient Re-evaluated:Patient Re-evaluated prior to induction Oxygen Delivery Method: Circle system utilized Preoxygenation: Pre-oxygenation with 100% oxygen Induction Type: IV induction Ventilation: Mask ventilation without difficulty Laryngoscope Size: Mac and 3 Tube type: Oral Tube size: 7.0 mm Number of attempts: 1 Airway Equipment and Method: Stylet Placement Confirmation: ETT inserted through vocal cords under direct vision,  positive ETCO2 and breath sounds checked- equal and bilateral Secured at: 18 cm Tube secured with: Tape Dental Injury: Teeth and Oropharynx as per pre-operative assessment

## 2017-08-02 NOTE — Anesthesia Postprocedure Evaluation (Signed)
Anesthesia Post Note  Patient: Michaela MarkerJoan E Wood  Procedure(s) Performed: Procedure(s) (LRB): ROBOTIC ASSISTED TOTAL HYSTERECTOMY/Bilateral Salpingectomy WITH UTEROSACRAL LIGAMENT SUSPENSION (Bilateral) ANTERIOR (CYSTOCELE) AND POSTERIOR REPAIR (RECTOCELE)/Perineorrhaphy (N/A) LYSIS OF LEFT SIGMOID ADHESION (Left)     Patient location during evaluation: PACU Anesthesia Type: General Level of consciousness: awake and alert Pain management: pain level controlled Vital Signs Assessment: post-procedure vital signs reviewed and stable Respiratory status: spontaneous breathing, nonlabored ventilation, respiratory function stable and patient connected to nasal cannula oxygen Cardiovascular status: blood pressure returned to baseline and stable Postop Assessment: no signs of nausea or vomiting Anesthetic complications: no    Last Vitals:  Vitals:   08/02/17 1215 08/02/17 1245  BP:  136/74  Pulse: 81 88  Resp: (!) 22 17  Temp:  36.7 C  SpO2: 100% 100%    Last Pain:  Vitals:   08/02/17 1245  TempSrc:   PainSc: 0-No pain   Pain Goal: Patients Stated Pain Goal: 4 (08/02/17 1245)               Phillips Groutarignan, Demani Mcbrien

## 2017-08-02 NOTE — Op Note (Signed)
08/02/2017  11:08 AM  PATIENT:  Jobe MarkerJoan E Solum  61 y.o. female  PRE-OPERATIVE DIAGNOSIS:  Pelvic Relaxation, Enterocele, Cystocele, Rectocele  POST-OPERATIVE DIAGNOSIS:  pelvic relaxation, enterocele, cystocele, rectocele Sigmoid adhesions to left adnexa  PROCEDURE:  Procedure(s): ROBOTIC ASSISTED TOTAL HYSTERECTOMY Mcall cul de plasty Bilateral Salpingectomy  UTEROSACRAL LIGAMENT SUSPENSION ANTERIOR (CYSTOCELE) AND POSTERIOR REPAIR (RECTOCELE)/Perineorrhaphy KELLY PLICATION LYSIS OF LEFT SIGMOID ADHESION TO LEFT ADNEXA  SURGEON:  Surgeon(s): Olivia Mackieaavon, Laniya Friedl, MD  ASSISTANTS: Fredric MareBailey, CNM, FACM   ANESTHESIA:   local and general  ESTIMATED BLOOD LOSS: 100cc  DRAINS: Urinary Catheter (Foley)   LOCAL MEDICATIONS USED:  MARCAINE    and Amount: 20 ml  SPECIMEN:  Source of Specimen:  uterus, cervix , tubes bilaterally  DISPOSITION OF SPECIMEN:  PATHOLOGY  COUNTS:  YES  DICTATION #: [id number]  PLAN OF CARE: admit, dc in am  PATIENT DISPOSITION:  PACU - hemodynamically stable.

## 2017-08-02 NOTE — Progress Notes (Signed)
Patient ID: Michaela Wood, female   DOB: 08/09/1956, 61 y.o.   MRN: [id number] Patient seen and examined. Consent witnessed and signed. No changes noted. Update completed.

## 2017-08-02 NOTE — Anesthesia Postprocedure Evaluation (Signed)
Anesthesia Post Note  Patient: Michaela Wood  Procedure(s) Performed: Procedure(s) (LRB): ROBOTIC ASSISTED TOTAL HYSTERECTOMY/Bilateral Salpingectomy WITH UTEROSACRAL LIGAMENT SUSPENSION (Bilateral) ANTERIOR (CYSTOCELE) AND POSTERIOR REPAIR (RECTOCELE)/Perineorrhaphy (N/A) LYSIS OF LEFT SIGMOID ADHESION (Left)     Patient location during evaluation: [facility] Anesthesia Type: General Level of consciousness: awake Pain management: pain level controlled Vital Signs Assessment: post-procedure vital signs reviewed and stable Respiratory status: spontaneous breathing Cardiovascular status: stable Postop Assessment: no signs of nausea or vomiting and adequate PO intake Anesthetic complications: no    Last Vitals:  Vitals:   08/02/17 1437 08/02/17 1500  BP:  134/67  Pulse:  92  Resp: 16 18  Temp:  (!) 36.3 C  SpO2:  99%    Last Pain:  Vitals:   08/02/17 1500  TempSrc: Oral  PainSc:    Pain Goal: Patients Stated Pain Goal: 4 (08/02/17 1245)               Aniket Paye

## 2017-08-03 ENCOUNTER — Encounter (HOSPITAL_COMMUNITY): Payer: Self-pay | Admitting: Obstetrics and Gynecology

## 2017-08-03 DIAGNOSIS — N8111 Cystocele, midline: Secondary | ICD-10-CM | POA: Diagnosis not present

## 2017-08-03 LAB — BASIC METABOLIC PANEL
Anion gap: 7 (ref 5–15)
BUN: 11 mg/dL (ref 6–20)
CO2: 29 mmol/L (ref 22–32)
CREATININE: 0.77 mg/dL (ref 0.44–1.00)
Calcium: 8.1 mg/dL — ABNORMAL LOW (ref 8.9–10.3)
Chloride: 101 mmol/L (ref 101–111)
GFR calc Af Amer: >60 mL/min (ref >60)
Glucose, Bld: 114 mg/dL — ABNORMAL HIGH (ref 65–99)
Potassium: 3.1 mmol/L — ABNORMAL LOW (ref 3.5–5.1)
Sodium: 137 mmol/L (ref 135–145)

## 2017-08-03 LAB — CBC
HCT: 35.6 % — ABNORMAL LOW (ref 36.0–46.0)
Hemoglobin: 12.5 g/dL (ref 12.0–15.0)
MCH: 32.4 pg (ref 26.0–34.0)
MCHC: 35.1 g/dL (ref 30.0–36.0)
MCV: 92.2 fL (ref 78.0–100.0)
Platelets: 250 10*3/uL (ref 150–400)
RBC: 3.86 MIL/uL — ABNORMAL LOW (ref 3.87–5.11)
RDW: 12.8 % (ref 11.5–15.5)
WBC: 12.6 10*3/uL — ABNORMAL HIGH (ref 4.0–10.5)

## 2017-08-03 MED ORDER — OXYCODONE-ACETAMINOPHEN 5-325 MG PO TABS: 1.0000 | ORAL_TABLET | ORAL | 0 refills | Status: AC | PRN

## 2017-08-03 MED ORDER — TRAMADOL HCL 50 MG PO TABS: 50.0000 mg | ORAL_TABLET | Freq: Four times a day (QID) | ORAL | 1 refills | Status: AC | PRN

## 2017-08-03 NOTE — Progress Notes (Signed)
1 Day Post-Op Procedure(s) (LRB): ROBOTIC ASSISTED TOTAL HYSTERECTOMY/Bilateral Salpingectomy WITH UTEROSACRAL LIGAMENT SUSPENSION (Bilateral) ANTERIOR (CYSTOCELE) AND POSTERIOR REPAIR (RECTOCELE)/Perineorrhaphy (N/A) LYSIS OF LEFT SIGMOID ADHESION (Left)  Subjective: Patient reports nausea, incisional pain, tolerating PO, + flatus and no problems voiding.    Objective: BP (!) 120/59 (BP Location: Right Arm) Comment: nurse notified  Pulse 79   Temp 97.8 F (36.6 C) (Oral)   Resp 18   Ht 5\' 3"  (1.6 m)   Wt 44.9 kg (99 lb)   SpO2 100%   BMI 17.54 kg/m   I have reviewed patient's vital signs, intake and output, medications and labs. CBC    Component Value Date/Time   WBC 12.6 (H) 08/03/2017 0509   RBC 3.86 (L) 08/03/2017 0509   HGB 12.5 08/03/2017 0509   HCT 35.6 (L) 08/03/2017 0509   PLT 250 08/03/2017 0509   MCV 92.2 08/03/2017 0509   MCH 32.4 08/03/2017 0509   MCHC 35.1 08/03/2017 0509   RDW 12.8 08/03/2017 0509   CMP     Component Value Date/Time   NA 140 07/20/2017 1025   K 3.1 (L) 07/20/2017 1025   CL 99 (L) 07/20/2017 1025   CO2 31 07/20/2017 1025   GLUCOSE 72 07/20/2017 1025   BUN 17 07/20/2017 1025   CREATININE 0.76 07/20/2017 1025   CALCIUM 9.5 07/20/2017 1025   PROT 7.7 09/15/2015 0930   ALBUMIN 4.4 09/15/2015 0930   AST 19 09/15/2015 0930   ALT 19 09/15/2015 0930   ALKPHOS 63 09/15/2015 0930   BILITOT 0.6 09/15/2015 0930   GFRNONAA >60 07/20/2017 1025   GFRAA >60 07/20/2017 1025     General: alert, cooperative and appears stated age Resp: clear to auscultation bilaterally Cardio: regular rate and rhythm, S1, S2 normal, no murmur, click, rub or gallop GI: soft, non-tender; bowel sounds normal; no masses,  no organomegaly and incision: clean, dry and intact Pelvic: minimal spotting , perineum intact Ext: neg c/c/e Neuro: intact  Assessment: s/p Procedure(s) with comments: ROBOTIC ASSISTED TOTAL HYSTERECTOMY/Bilateral Salpingectomy WITH  UTEROSACRAL LIGAMENT SUSPENSION (Bilateral) - Requests 3 1/2 hrs. ANTERIOR (CYSTOCELE) AND POSTERIOR REPAIR (RECTOCELE)/Perineorrhaphy (N/A) LYSIS OF LEFT SIGMOID ADHESION (Left): stable, progressing well and tolerating diet  Plan: Advance diet Encourage ambulation Advance to PO medication Discontinue IV fluids Discharge home  LOS: 0 days    [place]eca Gadbois J 08/03/2017, 6:41 AM

## 2017-08-03 NOTE — Progress Notes (Signed)
Pt out in wheelchair  Teaching complete   

## 2017-10-05 ENCOUNTER — Encounter (HOSPITAL_COMMUNITY): Payer: Self-pay | Admitting: Emergency Medicine

## 2017-10-05 ENCOUNTER — Emergency Department (HOSPITAL_COMMUNITY): Payer: BLUE CROSS/BLUE SHIELD

## 2017-10-05 ENCOUNTER — Emergency Department (HOSPITAL_COMMUNITY)
Admission: EM | Admit: 2017-10-05 | Discharge: 2017-10-06 | Disposition: A | Discharge status: Home / Self Care | Payer: BLUE CROSS/BLUE SHIELD | Attending: Emergency Medicine | Admitting: Emergency Medicine

## 2017-10-05 DIAGNOSIS — Z79899 Other long term (current) drug therapy: Secondary | ICD-10-CM | POA: Insufficient documentation

## 2017-10-05 DIAGNOSIS — E119 Type 2 diabetes mellitus without complications: Secondary | ICD-10-CM | POA: Diagnosis not present

## 2017-10-05 DIAGNOSIS — I10 Essential (primary) hypertension: Secondary | ICD-10-CM | POA: Insufficient documentation

## 2017-10-05 DIAGNOSIS — B349 Viral infection, unspecified: Secondary | ICD-10-CM | POA: Diagnosis not present

## 2017-10-05 DIAGNOSIS — R070 Pain in throat: Secondary | ICD-10-CM | POA: Diagnosis present

## 2017-10-05 DIAGNOSIS — J069 Acute upper respiratory infection, unspecified: Secondary | ICD-10-CM | POA: Diagnosis not present

## 2017-10-05 DIAGNOSIS — Z96651 Presence of right artificial knee joint: Secondary | ICD-10-CM | POA: Insufficient documentation

## 2017-10-05 DIAGNOSIS — B9789 Other viral agents as the cause of diseases classified elsewhere: Secondary | ICD-10-CM

## 2017-10-05 MED ORDER — IPRATROPIUM-ALBUTEROL 0.5-2.5 (3) MG/3ML IN SOLN
3.0000 mL | Freq: Once | RESPIRATORY_TRACT | Status: AC
  Administered 2017-10-05: 3 mL via RESPIRATORY_TRACT
  Filled 2017-10-05: qty 3

## 2017-10-05 MED ORDER — LIDOCAINE VISCOUS 2 % MT SOLN
15.0000 mL | Freq: Once | OROMUCOSAL | Status: AC
  Administered 2017-10-05: 15 mL via OROMUCOSAL
  Filled 2017-10-05: qty 15

## 2017-10-05 NOTE — ED Notes (Signed)
Pt back from x-ray.

## 2017-10-05 NOTE — ED Triage Notes (Addendum)
C/o sore throat and non-productive cough since Thursday.  Denies nasal congestion.  Denies fever at home.  Seen PCP for same on Thursday and taking cough medication without relief.  States she took Tramadol for sore throat prior to arrival without relief.

## 2017-10-05 NOTE — ED Provider Notes (Signed)
[facility] EMERGENCY DEPARTMENT Provider Note   CSN: [id number] Arrival date & time: 10/05/17  2018     History   Chief Complaint Chief Complaint  Patient presents with  . Sore Throat  . Cough    HPI Michaela Wood is a 61 y.o. female.  The history is provided by the patient and the spouse. No language interpreter was used.  Sore Throat  This is a new problem. The current episode started more than 2 days ago. The problem occurs constantly. The problem has been gradually worsening. Pertinent negatives include no chest pain, no abdominal pain, no headaches and no shortness of breath. The symptoms are aggravated by swallowing and coughing. Nothing relieves the symptoms. Treatments tried: promethazine-dextromethorphan. The treatment provided mild relief.  Cough  This is a new problem. The current episode started more than 2 days ago. The problem occurs every few minutes. The problem has been gradually worsening. The cough is productive of purulent sputum. Maximum temperature: low-grade fever. The fever has been present for 1 to 2 days. Associated symptoms include sore throat. Pertinent negatives include no chest pain, no chills, no sweats, no ear congestion, no ear pain, no headaches, no rhinorrhea, no myalgias, no shortness of breath, no wheezing and no eye redness. She has tried cough syrup for the symptoms. The treatment provided no relief. She is not a smoker. Her past medical history does not include bronchitis, pneumonia, bronchiectasis, COPD, emphysema or asthma.   No sick contacts.  She received her pneumonia shot within the last year.  She is up-to-date on her flu vaccination. PMH includes pre-DM.  Past Medical History:  Diagnosis Date  . Abnormal EKG    "told bundle branch blockage"-EKG brought in 09-11-07 copy with chart  . Arthritis   . Bruises easily    hx. of. Fell" knee gave way" Right cheek with recent suture removal  . Diabetes mellitus without  complication (HCC)    recently told- no meds as of yet  . GERD (gastroesophageal reflux disease)   . Hypertension   . PONV (postoperative nausea and vomiting)    only with wisdom teeth extraction  . Varicose veins 11-04-13   oral varicose veins-surgically excised  . Varicose veins 11-04-13   bilateral legs-no problems-09-15-15 remains not a problem    Patient Active Problem List   Diagnosis Date Noted  . Pelvic relaxation 08/02/2017  . OA (osteoarthritis) of knee 09/20/2015  . GERD (gastroesophageal reflux disease) 11/01/2014  . Cough 05/05/2014  . Lateral meniscal tear 11/05/2013    Past Surgical History:  Procedure Laterality Date  . ABDOMINAL HYSTERECTOMY    . ANTERIOR AND POSTERIOR REPAIR N/A 08/02/2017   Procedure: ANTERIOR (CYSTOCELE) AND POSTERIOR REPAIR (RECTOCELE)/Perineorrhaphy;  Surgeon: Olivia Mackie, MD;  Location: [facility];  Service: Gynecology;  Laterality: N/A;  . childbirth     x2 NVD  . COLONOSCOPY    . KNEE ARTHROSCOPY Right 11/05/2013   Procedure: RIGHT ARTHROSCOPY KNEE [facility] MENISCUS  DEBRIDEMENT;  Surgeon: Loanne Drilling, MD;  Location: [facility];  Service: Orthopedics;  Laterality: Right;  . LYSIS OF ADHESION Left 08/02/2017   Procedure: LYSIS OF LEFT SIGMOID ADHESION;  Surgeon: Olivia Mackie, MD;  Location: [facility];  Service: Gynecology;  Laterality: Left;  . ROBOTIC ASSISTED TOTAL HYSTERECTOMY WITH UTEROSACRAL LIGAMENT SUSPENSION Bilateral 08/02/2017   Procedure: ROBOTIC ASSISTED TOTAL HYSTERECTOMY/Bilateral Salpingectomy WITH UTEROSACRAL LIGAMENT SUSPENSION;  Surgeon: Olivia Mackie, MD;  Location: [facility];  Service: Gynecology;  Laterality: Bilateral;  Requests  3 1/2 hrs.  Marland Kitchen. TOTAL KNEE ARTHROPLASTY Right 09/20/2015   Procedure: TOTAL RIGHT KNEE ARTHROPLASTY;  Surgeon: Ollen GrossFrank Aluisio, MD;  Location: [facility];  Service: Orthopedics;  Laterality: Right;  . WISDOM TOOTH EXTRACTION      OB History    No data available       Home Medications    Prior  to Admission medications   Medication Sig Start Date End Date Taking? Authorizing Provider  Calcium Carbonate-Vitamin D (CALCIUM 500 + D PO) Take 1 tablet by mouth daily.    [provider]  esomeprazole (NEXIUM) 40 MG packet Take 40 mg by mouth daily before breakfast. Patient not taking: Reported on 09/10/2015 10/19/14   Waymon BudgeYoung, Clinton D, MD  estradiol (ESTRACE) 1 MG tablet Take 1 mg by mouth daily.    [provider]  gabapentin (NEURONTIN) 100 MG capsule Take 1 capsule (100 mg total) by mouth 3 (three) times daily. Patient not taking: Reported on 09/10/2015 06/15/14   Waymon BudgeYoung, Clinton D, MD  glimepiride (AMARYL) 4 MG tablet Take 2 mg by mouth daily with breakfast.    [provider]  hydrochlorothiazide (HYDRODIURIL) 50 MG tablet Take 25 mg by mouth every morning.  04/27/14   [provider]  lidocaine (XYLOCAINE) 2 % solution Use as directed 15 mLs in the mouth or throat every 3 (three) hours as needed for mouth pain. 10/06/17   Edgardo Petrenko A, PA-C  lovastatin (MEVACOR) 20 MG tablet Take 20 mg by mouth daily.    [provider]  methocarbamol (ROBAXIN) 500 MG tablet Take 1 tablet (500 mg total) by mouth every 6 (six) hours as needed for muscle spasms. Patient not taking: Reported on 07/18/2017 09/21/15   Julien GirtPerkins, Alexzandrew L, PA-C  Multiple Vitamin (MULTIVITAMIN WITH MINERALS) TABS tablet Take 1 tablet by mouth daily.    [provider]  multivitamin-lutein (OCUVITE-LUTEIN) CAPS capsule Take 1 capsule by mouth daily.    [provider]  oxyCODONE-acetaminophen (ROXICET) 5-325 MG tablet Take 1-2 tablets by mouth every 4 (four) hours as needed for severe pain. 08/03/17   Olivia Mackieaavon, Richard, MD  ranitidine (ZANTAC) 150 MG tablet Take 150 mg by mouth daily.    [provider]  traMADol (ULTRAM) 50 MG tablet Take 1-2 tablets (50-100 mg total) by mouth every 6 (six) hours as needed (mild pain). 08/03/17   Olivia Mackieaavon, Richard, MD    Family  History Family History  Problem Relation Age of Onset  . Heart disease Mother        left bundle block  . Diabetes Father   . Heart disease Son     Social History Social History  Substance Use Topics  . Smoking status: Never Smoker  . Smokeless tobacco: Never Used  . Alcohol use No     Allergies   Dilaudid [hydromorphone] and Codeine   Review of Systems Review of Systems  Constitutional: Positive for fever (low-grade). Negative for chills.  HENT: Positive for sore throat. Negative for ear pain, postnasal drip, rhinorrhea, sinus pain, sinus pressure, sneezing and trouble swallowing.   Eyes: Negative for redness.  Respiratory: Positive for cough. Negative for chest tightness, shortness of breath and wheezing.   Cardiovascular: Negative for chest pain.  Gastrointestinal: Negative for abdominal pain, diarrhea, nausea and vomiting.  Musculoskeletal: Negative for myalgias.  Neurological: Negative for weakness and headaches.    Physical Exam Updated Vital Signs BP 131/78 (BP Location: Right Arm)   Pulse 99   Temp 99 F (37.2 C) (  Oral)   Resp 16   Ht 5\' 1"  (1.549 m)   Wt 46.7 kg (103 lb)   SpO2 100%   BMI 19.46 kg/m   Physical Exam  Constitutional: No distress.  HENT:  Head: Normocephalic.  Right Ear: Hearing and ear canal normal. A middle ear effusion is present.  Left Ear: Hearing and ear canal normal. A middle ear effusion is present.  Nose: Mucosal edema and rhinorrhea present. Right sinus exhibits no maxillary sinus tenderness and no frontal sinus tenderness. Left sinus exhibits no maxillary sinus tenderness and no frontal sinus tenderness.  Mouth/Throat: Uvula is midline and mucous membranes are normal. Posterior oropharyngeal erythema present. No oropharyngeal exudate, posterior oropharyngeal edema or tonsillar abscesses. No tonsillar exudate.  Eyes: Conjunctivae are normal.  Neck: Normal range of motion. Neck supple.  No meningeal signs.  Cardiovascular:  Normal rate, regular rhythm and normal heart sounds.  Exam reveals no gallop and no friction rub.   No murmur heard. Pulmonary/Chest: Effort normal. No respiratory distress. She has wheezes. She has no rales. She exhibits no tenderness.  Intermittent scattered expiratory wheezes bilaterally.  Abdominal: Soft. She exhibits no distension. There is no tenderness. There is no guarding.  Musculoskeletal: Normal range of motion. She exhibits no edema, tenderness or deformity.  Neurological: She is alert.  Skin: Skin is warm. No rash noted. She is not diaphoretic.  Psychiatric: Her behavior is normal.  Nursing note and vitals reviewed.    ED Treatments / Results  Labs (all labs ordered are listed, but only abnormal results are displayed) Labs Reviewed - No data to display  EKG  EKG Interpretation None       Radiology Dg Chest 2 View  Result Date: 10/05/2017 CLINICAL DATA:  61 year old female with low-grade fever and cough. EXAM: CHEST  2 VIEW COMPARISON:  Chest radiograph dated 03/23/2014 FINDINGS: Minimal bibasilar atelectatic changes. There is no focal consolidation, pleural effusion, or pneumothorax. The cardiac silhouette is within normal limits. No acute osseous pathology. IMPRESSION: No active cardiopulmonary disease. Electronically Signed   By: Elgie Collard M.D.   On: 10/05/2017 23:27    Procedures Procedures (including critical care time)  Medications Ordered in ED Medications  lidocaine (XYLOCAINE) 2 % viscous mouth solution 15 mL (15 mLs Mouth/Throat Given 10/05/17 2322)  ipratropium-albuterol (DUONEB) 0.5-2.5 (3) MG/3ML nebulizer solution 3 mL (3 mLs Nebulization Given 10/05/17 2356)  dexamethasone (DECADRON) injection 10 mg (10 mg Intramuscular Given 10/06/17 0025)     Initial Impression / Assessment and Plan / ED Course  I have reviewed the triage vital signs and the nursing notes.  Pertinent labs & imaging results that were available during my care of the patient  were reviewed by me and considered in my medical decision making (see chart for details).     61 year old female presenting with intermittently productive cough, low-grade fever, and sore throat x1 day. CXR is negative for consolidation, pleural effusion, or PTX. Patient reports improvement of her cough and wheezing after Duoneb. Decadron given in the ED. Doubt strep pharyngitis, pneumonia, or influenza at this time. SaO2 at 100% on RA. Temperate 99 in the ED. She has good follow up with her PCP, whom she saw yesterday and was gave her promethazine DM cough syrup, which she reports improves her cough. Viscous lidocaine given in the ED with improvement of her sore throat.  Discussed with the patient that her symptoms are consistent with a viral infection and encouraged symptomatic treatment.  Strict return precautions given.  No  acute distress.  The patient is safe for discharge at this time.  Final Clinical Impressions(s) / ED Diagnoses   Final diagnoses:  Viral URI with cough    New Prescriptions Discharge Medication List as of 10/06/2017 12:18 AM    START taking these medications   Details  lidocaine (XYLOCAINE) 2 % solution Use as directed 15 mLs in the mouth or throat every 3 (three) hours as needed for mouth pain., Starting Sat 10/06/2017, Print         Caeley Dohrmann A, PA-C 10/06/17 0865    Jacalyn Lefevre, MD 10/08/17 1610

## 2017-10-06 MED ORDER — DEXAMETHASONE SODIUM PHOSPHATE 10 MG/ML IJ SOLN
10.0000 mg | Freq: Once | INTRAMUSCULAR | Status: AC
  Administered 2017-10-06: 10 mg via INTRAMUSCULAR
  Filled 2017-10-06: qty 1

## 2017-10-06 MED ORDER — LIDOCAINE VISCOUS 2 % MT SOLN: 15.0000 mL | OROMUCOSAL | 0 refills | Status: AC | PRN

## 2017-10-06 NOTE — Discharge Instructions (Signed)
Swallow 15 mL of lidocaine every 3 hours as needed for sore throat.  Please do not use this medication more than every 3 hours.  You can also try a cool mist humidifier in your home to see if that will help with decrease in coughing.  More information about cool mist humidifiers are attached.  You were given 1 dose of Decadron in the emergency department.  Please note this can cause your blood sugar to increase temporarily.  Continue to use the cough syrup as directed by your primary care provider.  600 mg of ibuprofen can be taken with food every 6-8 hours as needed for pain control.  You can also take 650 mg of Tylenol once every 6 hours if you develop a fever.  Cough can sometimes be the last symptom to go away after a viral illness.  Your x-ray did not show signs of pneumonia today.  If you develop a fever, shortness of breath, chest pain, or vomiting and diarrhea, please return to the emergency department for reevaluation.

## 2017-10-06 NOTE — ED Notes (Addendum)
Patient verbalized understanding of discharge instructions and denies any further needs or questions at this time. VS stable. Patient ambulatory with steady gait. Escorted to ED entrance in wheelchair.   

## 2019-10-27 ENCOUNTER — Other Ambulatory Visit: Payer: Self-pay | Admitting: Obstetrics and Gynecology

## 2019-10-27 DIAGNOSIS — M810 Age-related osteoporosis without current pathological fracture: Secondary | ICD-10-CM

## 2019-11-12 ENCOUNTER — Other Ambulatory Visit: Payer: Self-pay | Admitting: Obstetrics and Gynecology

## 2019-11-12 DIAGNOSIS — E559 Vitamin D deficiency, unspecified: Secondary | ICD-10-CM

## 2019-11-12 DIAGNOSIS — R5381 Other malaise: Secondary | ICD-10-CM

## 2019-11-17 ENCOUNTER — Other Ambulatory Visit: Payer: Self-pay | Admitting: Obstetrics and Gynecology

## 2019-11-17 DIAGNOSIS — M8580 Other specified disorders of bone density and structure, unspecified site: Secondary | ICD-10-CM

## 2019-11-17 DIAGNOSIS — E559 Vitamin D deficiency, unspecified: Secondary | ICD-10-CM

## 2019-11-20 ENCOUNTER — Other Ambulatory Visit: Payer: Self-pay

## 2019-11-20 ENCOUNTER — Ambulatory Visit
Admission: RE | Admit: 2019-11-20 | Discharge: 2019-11-20 | Disposition: A | Discharge status: Home / Self Care | Payer: BLUE CROSS/BLUE SHIELD | Source: Ambulatory Visit | Attending: Obstetrics and Gynecology | Admitting: Obstetrics and Gynecology

## 2019-11-20 DIAGNOSIS — E559 Vitamin D deficiency, unspecified: Secondary | ICD-10-CM

## 2019-11-20 DIAGNOSIS — M8580 Other specified disorders of bone density and structure, unspecified site: Secondary | ICD-10-CM

## 2020-02-06 ENCOUNTER — Ambulatory Visit: Payer: BC Managed Care – PPO | Attending: Internal Medicine

## 2020-02-06 DIAGNOSIS — Z23 Encounter for immunization: Secondary | ICD-10-CM | POA: Insufficient documentation

## 2020-02-06 NOTE — Progress Notes (Signed)
   Covid-19 Vaccination Clinic  Name:  Michaela Wood    MRN: [id number] DOB: [birth year]  02/06/2020  [name] was observed post Covid-19 immunization for 15 minutes without incident. She was provided with Vaccine Information Sheet and instruction to access the V-Safe system.   [name] was instructed to call 911 with any severe reactions post vaccine: Marland Kitchen Difficulty breathing  . Swelling of face and throat  . A fast heartbeat  . A bad rash all over body  . Dizziness and weakness   Immunizations Administered    Name Date Dose VIS Date Route   Pfizer COVID-19 Vaccine 02/06/2020  2:01 PM 0.3 mL 11/14/2019 Intramuscular   Manufacturer: [employer], [employer]   Lot: [id number]   NDC: [address]-6

## 2020-03-05 ENCOUNTER — Ambulatory Visit: Payer: BC Managed Care – PPO | Attending: Internal Medicine

## 2020-03-05 DIAGNOSIS — Z23 Encounter for immunization: Secondary | ICD-10-CM

## 2020-03-05 NOTE — Progress Notes (Signed)
   Covid-19 Vaccination Clinic  Name:  WINDY DUDEK    MRN: [id number] DOB: [birth year]  03/05/2020  Ms. Calmes was observed post Covid-19 immunization for 15 minutes without incident. She was provided with Vaccine Information Sheet and instruction to access the V-Safe system.   Ms. Degrave was instructed to call 911 with any severe reactions post vaccine: Marland Kitchen Difficulty breathing  . Swelling of face and throat  . A fast heartbeat  . A bad rash all over body  . Dizziness and weakness   Immunizations Administered    Name Date Dose VIS Date Route   Pfizer COVID-19 Vaccine 03/05/2020 12:48 PM 0.3 mL 11/14/2019 Intramuscular   Manufacturer: [employer], [employer]   Lot: [id number]   NDC: [address]-1

## 2020-03-10 ENCOUNTER — Ambulatory Visit: Payer: BC Managed Care – PPO

## 2020-04-22 ENCOUNTER — Encounter: Payer: Self-pay | Admitting: Podiatry

## 2020-04-22 ENCOUNTER — Other Ambulatory Visit: Payer: Self-pay

## 2020-04-22 ENCOUNTER — Ambulatory Visit: Payer: BC Managed Care – PPO | Admitting: Podiatry

## 2020-04-22 VITALS — BP 156/74 | HR 69 | Temp 97.3°F

## 2020-04-22 DIAGNOSIS — M79672 Pain in left foot: Secondary | ICD-10-CM | POA: Diagnosis not present

## 2020-04-22 DIAGNOSIS — Q828 Other specified congenital malformations of skin: Secondary | ICD-10-CM | POA: Diagnosis not present

## 2020-04-22 NOTE — Patient Instructions (Signed)

## 2020-04-23 NOTE — Progress Notes (Signed)
Subjective:   Patient ID: Michaela Wood, female   DOB: 64 y.o.   MRN: [id number]   HPI 64 year old female presents the office today for concerns of a painful callus on the bottom of her left foot.  Ongoing for some time but seems to be getting worse.  Denies any drainage or pus or any swelling.  She thinks that she has been walking differently since the knee replacement which may be contributing to the symptoms.  She denies of any foreign objects.  She has no other concerns today.   Review of Systems  All other systems reviewed and are negative.  Past Medical History:  Diagnosis Date  . Abnormal EKG    "told bundle branch blockage"-EKG brought in 09-11-07 copy with chart  . Arthritis   . Bruises easily    hx. of. Fell" knee gave way" Right cheek with recent suture removal  . Diabetes mellitus without complication (HCC)    recently told- no meds as of yet  . GERD (gastroesophageal reflux disease)   . Hypertension   . PONV (postoperative nausea and vomiting)    only with wisdom teeth extraction  . Varicose veins 11-04-13   oral varicose veins-surgically excised  . Varicose veins 11-04-13   bilateral legs-no problems-09-15-15 remains not a problem    Past Surgical History:  Procedure Laterality Date  . ABDOMINAL HYSTERECTOMY    . ANTERIOR AND POSTERIOR REPAIR N/A 08/02/2017   Procedure: ANTERIOR (CYSTOCELE) AND POSTERIOR REPAIR (RECTOCELE)/Perineorrhaphy;  Surgeon: Olivia Mackie, MD;  Location: [facility];  Service: Gynecology;  Laterality: N/A;  . childbirth     x2 NVD  . COLONOSCOPY    . KNEE ARTHROSCOPY Right 11/05/2013   Procedure: RIGHT ARTHROSCOPY KNEE [facility] MENISCUS  DEBRIDEMENT;  Surgeon: Loanne Drilling, MD;  Location: [facility];  Service: Orthopedics;  Laterality: Right;  . LYSIS OF ADHESION Left 08/02/2017   Procedure: LYSIS OF LEFT SIGMOID ADHESION;  Surgeon: Olivia Mackie, MD;  Location: [facility];  Service: Gynecology;  Laterality: Left;  . ROBOTIC ASSISTED TOTAL  HYSTERECTOMY WITH UTEROSACRAL LIGAMENT SUSPENSION Bilateral 08/02/2017   Procedure: ROBOTIC ASSISTED TOTAL HYSTERECTOMY/Bilateral Salpingectomy WITH UTEROSACRAL LIGAMENT SUSPENSION;  Surgeon: Olivia Mackie, MD;  Location: [facility];  Service: Gynecology;  Laterality: Bilateral;  Requests 3 1/2 hrs.  Marland Kitchen TOTAL KNEE ARTHROPLASTY Right 09/20/2015   Procedure: TOTAL RIGHT KNEE ARTHROPLASTY;  Surgeon: Ollen Gross, MD;  Location: [facility];  Service: Orthopedics;  Laterality: Right;  . WISDOM TOOTH EXTRACTION       Current Outpatient Medications:  .  Calcium Carbonate-Vitamin D (CALCIUM 500 + D PO), Take 1 tablet by mouth daily., Disp: , Rfl:  .  esomeprazole (NEXIUM) 40 MG packet, Take 40 mg by mouth daily before breakfast. (Patient not taking: Reported on 09/10/2015), Disp: 30 each, Rfl: 12 .  estradiol (ESTRACE) 1 MG tablet, Take 1 mg by mouth daily., Disp: , Rfl:  .  famotidine (PEPCID) 20 MG tablet, Take 20 mg by mouth daily., Disp: , Rfl:  .  gabapentin (NEURONTIN) 100 MG capsule, Take 1 capsule (100 mg total) by mouth 3 (three) times daily. (Patient not taking: Reported on 09/10/2015), Disp: 30 capsule, Rfl: 1 .  glimepiride (AMARYL) 4 MG tablet, Take 2 mg by mouth daily with breakfast., Disp: , Rfl:  .  hydrochlorothiazide (HYDRODIURIL) 50 MG tablet, Take 25 mg by mouth every morning. , Disp: , Rfl:  .  lidocaine (XYLOCAINE) 2 % solution, Use as directed 15 mLs in the mouth or throat  every 3 (three) hours as needed for mouth pain., Disp: 100 mL, Rfl: 0 .  losartan (COZAAR) 50 MG tablet, Take 50 mg by mouth daily., Disp: , Rfl:  .  lovastatin (MEVACOR) 20 MG tablet, Take 20 mg by mouth daily., Disp: , Rfl:  .  methocarbamol (ROBAXIN) 500 MG tablet, Take 1 tablet (500 mg total) by mouth every 6 (six) hours as needed for muscle spasms. (Patient not taking: Reported on 07/18/2017), Disp: 80 tablet, Rfl: 0 .  Multiple Vitamin (MULTIVITAMIN WITH MINERALS) TABS tablet, Take 1 tablet by mouth daily., Disp: ,  Rfl:  .  multivitamin-lutein (OCUVITE-LUTEIN) CAPS capsule, Take 1 capsule by mouth daily., Disp: , Rfl:  .  MYRBETRIQ 25 MG TB24 tablet, Take 25 mg by mouth daily., Disp: , Rfl:  .  oxyCODONE-acetaminophen (ROXICET) 5-325 MG tablet, Take 1-2 tablets by mouth every 4 (four) hours as needed for severe pain., Disp: 30 tablet, Rfl: 0 .  ranitidine (ZANTAC) 150 MG tablet, Take 150 mg by mouth daily., Disp: , Rfl:  .  traMADol (ULTRAM) 50 MG tablet, Take 1-2 tablets (50-100 mg total) by mouth every 6 (six) hours as needed (mild pain)., Disp: 30 tablet, Rfl: 1  Allergies  Allergen Reactions  . Dilaudid [Hydromorphone] Nausea And Vomiting  . Codeine Swelling and Rash        Objective:  Physical Exam  General: AAO x3, NAD  Dermatological: Small punctate annular hyperkeratotic lesion submetatarsal 2 with tenderness palpation.  Also similar lesion submetatarsal 5.  Upon debridement there is no ongoing ulceration drainage or signs of infection.  No evidence of verruca or foreign body.  Vascular: Dorsalis Pedis artery and Posterior Tibial artery pedal pulses are 2/4 bilateral with immedate capillary fill time.  There is no pain with calf compression, swelling, warmth, erythema.   Neruologic: Grossly intact via light touch bilateral.   Musculoskeletal: Prominence of metatarsal heads plantarly with atrophy of the fat pad.  Muscular strength 5/5 in all groups tested bilateral.  Gait: Unassisted, Nonantalgic.       Assessment:   Porokeratosis left foot; prominent metatarsal heads.     Plan:  -Treatment options discussed including all alternatives, risks, and complications -Etiology of symptoms were discussed -Sharply debrided x2 without any complications or bleeding.  Recommend moisturizer.  Offloading pads were dispensed.  Discussed shoe modifications and better arch support.    Trula Slade DPM

## 2021-12-20 ENCOUNTER — Other Ambulatory Visit: Payer: Self-pay | Admitting: Obstetrics and Gynecology

## 2021-12-20 DIAGNOSIS — R5381 Other malaise: Secondary | ICD-10-CM

## 2022-03-27 ENCOUNTER — Other Ambulatory Visit: Payer: Self-pay | Admitting: Obstetrics and Gynecology

## 2022-03-27 DIAGNOSIS — E2839 Other primary ovarian failure: Secondary | ICD-10-CM

## 2022-03-27 DIAGNOSIS — E569 Vitamin deficiency, unspecified: Secondary | ICD-10-CM

## 2022-09-14 ENCOUNTER — Ambulatory Visit
Admission: RE | Admit: 2022-09-14 | Discharge: 2022-09-14 | Disposition: A | Discharge status: Home / Self Care | Payer: Medicare HMO | Source: Ambulatory Visit | Attending: Obstetrics and Gynecology | Admitting: Obstetrics and Gynecology

## 2022-09-14 DIAGNOSIS — E569 Vitamin deficiency, unspecified: Secondary | ICD-10-CM

## 2022-09-14 DIAGNOSIS — E2839 Other primary ovarian failure: Secondary | ICD-10-CM

## 2022-11-13 ENCOUNTER — Ambulatory Visit: Payer: Medicare HMO | Admitting: Podiatry

## 2022-11-13 ENCOUNTER — Encounter: Payer: Self-pay | Admitting: Podiatry

## 2022-11-13 VITALS — BP 163/78 | HR 108

## 2022-11-13 DIAGNOSIS — M79674 Pain in right toe(s): Secondary | ICD-10-CM

## 2022-11-13 DIAGNOSIS — M79675 Pain in left toe(s): Secondary | ICD-10-CM

## 2022-11-13 DIAGNOSIS — B351 Tinea unguium: Secondary | ICD-10-CM

## 2022-11-13 NOTE — Progress Notes (Signed)
   Chief Complaint  Patient presents with   foot care    Patient is here for diabetic foot.    SUBJECTIVE Patient presents to office today complaining of elongated, thickened nails that cause pain while ambulating in shoes.  Patient is unable to trim their own nails. Patient is here for further evaluation and treatment.  Past Medical History:  Diagnosis Date   Abnormal EKG    "told bundle branch blockage"-EKG brought in 09-11-07 copy with chart   Arthritis    Bruises easily    hx. of. [place]" knee gave way" Right cheek with recent suture removal   Diabetes mellitus without complication (HCC)    recently told- no meds as of yet   GERD (gastroesophageal reflux disease)    Hypertension    PONV (postoperative nausea and vomiting)    only with wisdom teeth extraction   Varicose veins 11-04-13   oral varicose veins-surgically excised   Varicose veins 11-04-13   bilateral legs-no problems-09-15-15 remains not a problem    Allergies  Allergen Reactions   Dilaudid [Hydromorphone] Nausea And Vomiting   Codeine Swelling and Rash     OBJECTIVE General Patient is awake, alert, and oriented x 3 and in no acute distress. Derm Skin is dry and supple bilateral. Negative open lesions or macerations. Remaining integument unremarkable. Nails are tender, long, thickened and dystrophic with subungual debris, consistent with onychomycosis, 1-5 bilateral. No signs of infection noted. Vasc  DP and PT pedal pulses palpable bilaterally. Temperature gradient within normal limits.  Neuro Epicritic and protective threshold sensation grossly intact bilaterally.  Musculoskeletal Exam No symptomatic pedal deformities noted bilateral. Muscular strength within normal limits.  ASSESSMENT 1.  Pain due to onychomycosis of toenails both  PLAN OF CARE 1. Patient evaluated today.  2. Instructed to maintain good pedal hygiene and foot care.  3. Mechanical debridement of nails 1-5 bilaterally performed using a nail  nipper. Filed with dremel without incident.  4. Return to clinic in 3 mos.    Felecia Shelling, DPM [facility]  Dr. Felecia Shelling, DPM    [address] [place], [place] [address]                Office [phone]  Fax [phone]

## 2023-02-12 ENCOUNTER — Ambulatory Visit: Payer: Medicare HMO | Admitting: Podiatry

## 2023-02-12 DIAGNOSIS — B351 Tinea unguium: Secondary | ICD-10-CM

## 2023-02-12 DIAGNOSIS — M79674 Pain in right toe(s): Secondary | ICD-10-CM

## 2023-02-12 DIAGNOSIS — M79675 Pain in left toe(s): Secondary | ICD-10-CM | POA: Diagnosis not present

## 2023-02-12 NOTE — Progress Notes (Signed)
   Chief Complaint  Patient presents with   Nail Problem    Routine foot care, nail trim     SUBJECTIVE Patient presents to office today complaining of elongated, thickened nails that cause pain while ambulating in shoes.  Patient is unable to trim their own nails. Patient is here for further evaluation and treatment.  Past Medical History:  Diagnosis Date   Abnormal EKG    "told bundle branch blockage"-EKG brought in 09-11-07 copy with chart   Arthritis    Bruises easily    hx. of. [place]" knee gave way" Right cheek with recent suture removal   Diabetes mellitus without complication ([place])    recently told- no meds as of yet   GERD (gastroesophageal reflux disease)    Hypertension    PONV (postoperative nausea and vomiting)    only with wisdom teeth extraction   Varicose veins 11-04-13   oral varicose veins-surgically excised   Varicose veins 11-04-13   bilateral legs-no problems-09-15-15 remains not a problem    Allergies  Allergen Reactions   Dilaudid [Hydromorphone] Nausea And Vomiting   Codeine Swelling and Rash     OBJECTIVE General Patient is awake, alert, and oriented x 3 and in no acute distress. Derm Skin is dry and supple bilateral. Negative open lesions or macerations. Remaining integument unremarkable. Nails are tender, long, thickened and dystrophic with subungual debris, consistent with onychomycosis, 1-5 bilateral. No signs of infection noted. Vasc  DP and PT pedal pulses palpable bilaterally. Temperature gradient within normal limits.  Neuro Epicritic and protective threshold sensation grossly intact bilaterally.  Musculoskeletal Exam No symptomatic pedal deformities noted bilateral. Muscular strength within normal limits.  ASSESSMENT 1.  Pain due to onychomycosis of toenails both  PLAN OF CARE 1. Patient evaluated today.  2. Instructed to maintain good pedal hygiene and foot care.  3. Mechanical debridement of nails 1-5 bilaterally performed using a nail  nipper. Filed with dremel without incident.  4. Return to clinic in 3 mos.    Edrick Kins, DPM [facility]  Dr. Edrick Kins, DPM    [address], [place] [address]                Office [phone]  Fax [phone]

## 2023-05-16 ENCOUNTER — Ambulatory Visit: Payer: Medicare HMO | Admitting: Podiatry

## 2023-05-16 DIAGNOSIS — B351 Tinea unguium: Secondary | ICD-10-CM | POA: Diagnosis not present

## 2023-05-16 DIAGNOSIS — M79675 Pain in left toe(s): Secondary | ICD-10-CM | POA: Diagnosis not present

## 2023-05-16 DIAGNOSIS — M79674 Pain in right toe(s): Secondary | ICD-10-CM

## 2023-05-16 NOTE — Progress Notes (Signed)
   Chief Complaint  Patient presents with   Diabetes    Diabetic foot care, A1c- 7.0 BG- not taking, Nail and callus trim     SUBJECTIVE Patient presents to office today complaining of elongated, thickened nails that cause pain while ambulating in shoes.  Patient is unable to trim their own nails. Patient is here for further evaluation and treatment.  Past Medical History:  Diagnosis Date   Abnormal EKG    "told bundle branch blockage"-EKG brought in 09-11-07 copy with chart   Arthritis    Bruises easily    hx. of. Larey Seat" knee gave way" Right cheek with recent suture removal   Diabetes mellitus without complication (HCC)    recently told- no meds as of yet   GERD (gastroesophageal reflux disease)    Hypertension    PONV (postoperative nausea and vomiting)    only with wisdom teeth extraction   Varicose veins 11-04-13   oral varicose veins-surgically excised   Varicose veins 11-04-13   bilateral legs-no problems-09-15-15 remains not a problem    Allergies  Allergen Reactions   Dilaudid [Hydromorphone] Nausea And Vomiting   Codeine Swelling and Rash     OBJECTIVE General Patient is awake, alert, and oriented x 3 and in no acute distress. Derm Skin is dry and supple bilateral. Negative open lesions or macerations. Remaining integument unremarkable. Nails are tender, long, thickened and dystrophic with subungual debris, consistent with onychomycosis, 1-5 bilateral. No signs of infection noted. Vasc  DP and PT pedal pulses palpable bilaterally. Temperature gradient within normal limits.  Neuro Epicritic and protective threshold sensation grossly intact bilaterally.  Musculoskeletal Exam No symptomatic pedal deformities noted bilateral. Muscular strength within normal limits.  ASSESSMENT 1.  Pain due to onychomycosis of toenails both  PLAN OF CARE 1. Patient evaluated today.  2. Instructed to maintain good pedal hygiene and foot care.  3. Mechanical debridement of nails 1-5  bilaterally performed using a nail nipper. Filed with dremel without incident.  4. Return to clinic in 3 mos.    Felecia Shelling, DPM [facility]  Dr. Felecia Shelling, DPM    [address] [place], [place] [address]                Office [phone]  Fax [phone]

## 2023-08-20 ENCOUNTER — Ambulatory Visit: Payer: Medicare HMO | Admitting: Podiatry

## 2023-08-20 ENCOUNTER — Encounter: Payer: Self-pay | Admitting: Podiatry

## 2023-08-20 DIAGNOSIS — M79675 Pain in left toe(s): Secondary | ICD-10-CM | POA: Diagnosis not present

## 2023-08-20 DIAGNOSIS — B351 Tinea unguium: Secondary | ICD-10-CM | POA: Diagnosis not present

## 2023-08-20 DIAGNOSIS — M79674 Pain in right toe(s): Secondary | ICD-10-CM | POA: Diagnosis not present

## 2023-08-20 NOTE — Progress Notes (Signed)
   Chief Complaint  Patient presents with   Debridement    Trim toenails-diabetic     SUBJECTIVE Patient presents to office today complaining of elongated, thickened nails that cause pain while ambulating in shoes.  Patient is unable to trim their own nails. Patient is here for further evaluation and treatment.  Past Medical History:  Diagnosis Date   Abnormal EKG    "told bundle branch blockage"-EKG brought in 09-11-07 copy with chart   Arthritis    Bruises easily    hx. of. Larey Seat" knee gave way" Right cheek with recent suture removal   Diabetes mellitus without complication (HCC)    recently told- no meds as of yet   GERD (gastroesophageal reflux disease)    Hypertension    PONV (postoperative nausea and vomiting)    only with wisdom teeth extraction   Varicose veins 11-04-13   oral varicose veins-surgically excised   Varicose veins 11-04-13   bilateral legs-no problems-09-15-15 remains not a problem    Allergies  Allergen Reactions   Dilaudid [Hydromorphone] Nausea And Vomiting   Codeine Swelling and Rash     OBJECTIVE General Patient is awake, alert, and oriented x 3 and in no acute distress. Derm Skin is dry and supple bilateral. Negative open lesions or macerations. Remaining integument unremarkable. Nails are tender, long, thickened and dystrophic with subungual debris, consistent with onychomycosis, 1-5 bilateral. No signs of infection noted. Vasc  DP and PT pedal pulses palpable bilaterally. Temperature gradient within normal limits.  Neuro Epicritic and protective threshold sensation grossly intact bilaterally.  Musculoskeletal Exam No symptomatic pedal deformities noted bilateral. Muscular strength within normal limits.  ASSESSMENT 1.  Pain due to onychomycosis of toenails both  PLAN OF CARE 1. Patient evaluated today.  2. Instructed to maintain good pedal hygiene and foot care.  3. Mechanical debridement of nails 1-5 bilaterally performed using a nail nipper.  Filed with dremel without incident.  4. Return to clinic in 3 mos.    Felecia Shelling, DPM [facility]  Dr. Felecia Shelling, DPM    [address] [place], [place] [address]                Office [phone]  Fax [phone]

## 2023-11-19 ENCOUNTER — Ambulatory Visit: Payer: Medicare HMO | Admitting: Podiatry

## 2023-11-21 ENCOUNTER — Encounter: Payer: Self-pay | Admitting: Podiatry

## 2023-11-21 ENCOUNTER — Ambulatory Visit (INDEPENDENT_AMBULATORY_CARE_PROVIDER_SITE_OTHER): Payer: Medicare HMO | Admitting: Podiatry

## 2023-11-21 DIAGNOSIS — B351 Tinea unguium: Secondary | ICD-10-CM | POA: Diagnosis not present

## 2023-11-21 DIAGNOSIS — M79675 Pain in left toe(s): Secondary | ICD-10-CM | POA: Diagnosis not present

## 2023-11-21 DIAGNOSIS — M79674 Pain in right toe(s): Secondary | ICD-10-CM

## 2023-11-21 NOTE — Progress Notes (Signed)
   Chief Complaint  Patient presents with   [facility]    RM#7 [facility]     SUBJECTIVE Patient presents to office today complaining of elongated, thickened nails that cause pain while ambulating in shoes.  Patient is unable to trim their own nails. Patient is here for further evaluation and treatment.  Past Medical History:  Diagnosis Date   Abnormal EKG    "told bundle branch blockage"-EKG brought in 09-11-07 copy with chart   Arthritis    Bruises easily    hx. of. Larey Seat" knee gave way" Right cheek with recent suture removal   Diabetes mellitus without complication (HCC)    recently told- no meds as of yet   GERD (gastroesophageal reflux disease)    Hypertension    PONV (postoperative nausea and vomiting)    only with wisdom teeth extraction   Varicose veins 11-04-13   oral varicose veins-surgically excised   Varicose veins 11-04-13   bilateral legs-no problems-09-15-15 remains not a problem    Allergies  Allergen Reactions   Dilaudid [Hydromorphone] Nausea And Vomiting   Codeine Swelling and Rash     OBJECTIVE General Patient is awake, alert, and oriented x 3 and in no acute distress. Derm Skin is dry and supple bilateral. Negative open lesions or macerations. Remaining integument unremarkable. Nails are tender, long, thickened and dystrophic with subungual debris, consistent with onychomycosis, 1-5 bilateral. No signs of infection noted. Vasc  DP and PT pedal pulses palpable bilaterally. Temperature gradient within normal limits.  Neuro Epicritic and protective threshold sensation grossly intact bilaterally.  Musculoskeletal Exam No symptomatic pedal deformities noted bilateral. Muscular strength within normal limits.  ASSESSMENT 1.  Pain due to onychomycosis of toenails both  PLAN OF CARE 1. Patient evaluated today.  2. Instructed to maintain good pedal hygiene and foot care.  3. Mechanical debridement of nails 1-5 bilaterally performed using a nail nipper. Filed with dremel  without incident.  4. Return to clinic in 3 mos.    Felecia Shelling, DPM [facility]  Dr. Felecia Shelling, DPM    [address] [place], [place] [address]                Office [phone]  Fax [phone]

## 2024-02-20 ENCOUNTER — Encounter: Payer: Self-pay | Admitting: Podiatry

## 2024-02-20 ENCOUNTER — Ambulatory Visit (INDEPENDENT_AMBULATORY_CARE_PROVIDER_SITE_OTHER): Payer: Medicare HMO | Admitting: Podiatry

## 2024-02-20 VITALS — Ht 61.0 in | Wt 103.0 lb

## 2024-02-20 DIAGNOSIS — M79675 Pain in left toe(s): Secondary | ICD-10-CM

## 2024-02-20 DIAGNOSIS — M79674 Pain in right toe(s): Secondary | ICD-10-CM | POA: Diagnosis not present

## 2024-02-20 DIAGNOSIS — B351 Tinea unguium: Secondary | ICD-10-CM

## 2024-02-20 NOTE — Progress Notes (Signed)
   Chief Complaint  Patient presents with   Nail Problem    Pt is here for [facility].    SUBJECTIVE Patient presents to office today complaining of elongated, thickened nails that cause pain while ambulating in shoes.  Patient is unable to trim their own nails. Patient is here for further evaluation and treatment.  Past Medical History:  Diagnosis Date   Abnormal EKG    "told bundle branch blockage"-EKG brought in 09-11-07 copy with chart   Arthritis    Bruises easily    hx. of. Larey Seat" knee gave way" Right cheek with recent suture removal   Diabetes mellitus without complication (HCC)    recently told- no meds as of yet   GERD (gastroesophageal reflux disease)    Hypertension    PONV (postoperative nausea and vomiting)    only with wisdom teeth extraction   Varicose veins 11-04-13   oral varicose veins-surgically excised   Varicose veins 11-04-13   bilateral legs-no problems-09-15-15 remains not a problem    Allergies  Allergen Reactions   Dilaudid [Hydromorphone] Nausea And Vomiting   Codeine Swelling and Rash     OBJECTIVE General Patient is awake, alert, and oriented x 3 and in no acute distress. Derm Skin is dry and supple bilateral. Negative open lesions or macerations. Remaining integument unremarkable. Nails are tender, long, thickened and dystrophic with subungual debris, consistent with onychomycosis, 1-5 bilateral. No signs of infection noted. Vasc  DP and PT pedal pulses palpable bilaterally. Temperature gradient within normal limits.  Neuro Epicritic and protective threshold sensation grossly intact bilaterally.  Musculoskeletal Exam No symptomatic pedal deformities noted bilateral. Muscular strength within normal limits.  ASSESSMENT 1.  Pain due to onychomycosis of toenails both  PLAN OF CARE 1. Patient evaluated today.  2. Instructed to maintain good pedal hygiene and foot care.  3. Mechanical debridement of nails 1-5 bilaterally performed using a nail nipper.  Filed with dremel without incident.  4. Return to clinic in 3 mos.    Felecia Shelling, DPM [facility]  Dr. Felecia Shelling, DPM    [address] [place], [place] [address]                Office [phone]  Fax [phone]

## 2024-05-26 ENCOUNTER — Ambulatory Visit: Admitting: Podiatry

## 2024-05-26 ENCOUNTER — Encounter: Payer: Self-pay | Admitting: Podiatry

## 2024-05-26 VITALS — Ht 61.0 in | Wt 103.0 lb

## 2024-05-26 DIAGNOSIS — M79675 Pain in left toe(s): Secondary | ICD-10-CM | POA: Diagnosis not present

## 2024-05-26 DIAGNOSIS — M79674 Pain in right toe(s): Secondary | ICD-10-CM | POA: Diagnosis not present

## 2024-05-26 DIAGNOSIS — B351 Tinea unguium: Secondary | ICD-10-CM | POA: Diagnosis not present

## 2024-05-26 NOTE — Progress Notes (Signed)
   Chief Complaint  Patient presents with   Nail Problem    Pt is here for [facility].    SUBJECTIVE Patient presents to office today complaining of elongated, thickened nails that cause pain while ambulating in shoes.  Patient is unable to trim their own nails. Patient is here for further evaluation and treatment.  Past Medical History:  Diagnosis Date   Abnormal EKG    "told bundle branch blockage"-EKG brought in 09-11-07 copy with chart   Arthritis    Bruises easily    hx. of. Larey Seat" knee gave way" Right cheek with recent suture removal   Diabetes mellitus without complication (HCC)    recently told- no meds as of yet   GERD (gastroesophageal reflux disease)    Hypertension    PONV (postoperative nausea and vomiting)    only with wisdom teeth extraction   Varicose veins 11-04-13   oral varicose veins-surgically excised   Varicose veins 11-04-13   bilateral legs-no problems-09-15-15 remains not a problem    Allergies  Allergen Reactions   Dilaudid [Hydromorphone] Nausea And Vomiting   Codeine Swelling and Rash     OBJECTIVE General Patient is awake, alert, and oriented x 3 and in no acute distress. Derm Skin is dry and supple bilateral. Negative open lesions or macerations. Remaining integument unremarkable. Nails are tender, long, thickened and dystrophic with subungual debris, consistent with onychomycosis, 1-5 bilateral. No signs of infection noted. Vasc  DP and PT pedal pulses palpable bilaterally. Temperature gradient within normal limits.  Neuro Epicritic and protective threshold sensation grossly intact bilaterally.  Musculoskeletal Exam No symptomatic pedal deformities noted bilateral. Muscular strength within normal limits.  ASSESSMENT 1.  Pain due to onychomycosis of toenails both  PLAN OF CARE 1. Patient evaluated today.  2. Instructed to maintain good pedal hygiene and foot care.  3. Mechanical debridement of nails 1-5 bilaterally performed using a nail nipper.  Filed with dremel without incident.  4. Return to clinic in 3 mos.    Felecia Shelling, DPM [facility]  Dr. Felecia Shelling, DPM    [address] [place], [place] [address]                Office [phone]  Fax [phone]

## 2024-08-27 ENCOUNTER — Encounter: Payer: Self-pay | Admitting: Podiatry

## 2024-08-27 ENCOUNTER — Ambulatory Visit: Admitting: Podiatry

## 2024-08-27 VITALS — Ht 61.0 in | Wt 103.0 lb

## 2024-08-27 DIAGNOSIS — M79674 Pain in right toe(s): Secondary | ICD-10-CM

## 2024-08-27 DIAGNOSIS — B351 Tinea unguium: Secondary | ICD-10-CM | POA: Diagnosis not present

## 2024-08-27 DIAGNOSIS — M79675 Pain in left toe(s): Secondary | ICD-10-CM

## 2024-08-27 NOTE — Progress Notes (Signed)
   Chief Complaint  Patient presents with   Nail Problem    Pt is here for [facility].    SUBJECTIVE Patient presents to office today complaining of elongated, thickened nails that cause pain while ambulating in shoes.  Patient is unable to trim their own nails. Patient is here for further evaluation and treatment.  Past Medical History:  Diagnosis Date   Abnormal EKG    "told bundle branch blockage"-EKG brought in 09-11-07 copy with chart   Arthritis    Bruises easily    hx. of. Larey Seat" knee gave way" Right cheek with recent suture removal   Diabetes mellitus without complication (HCC)    recently told- no meds as of yet   GERD (gastroesophageal reflux disease)    Hypertension    PONV (postoperative nausea and vomiting)    only with wisdom teeth extraction   Varicose veins 11-04-13   oral varicose veins-surgically excised   Varicose veins 11-04-13   bilateral legs-no problems-09-15-15 remains not a problem    Allergies  Allergen Reactions   Dilaudid [Hydromorphone] Nausea And Vomiting   Codeine Swelling and Rash     OBJECTIVE General Patient is awake, alert, and oriented x 3 and in no acute distress. Derm Skin is dry and supple bilateral. Negative open lesions or macerations. Remaining integument unremarkable. Nails are tender, long, thickened and dystrophic with subungual debris, consistent with onychomycosis, 1-5 bilateral. No signs of infection noted. Vasc  DP and PT pedal pulses palpable bilaterally. Temperature gradient within normal limits.  Neuro Epicritic and protective threshold sensation grossly intact bilaterally.  Musculoskeletal Exam No symptomatic pedal deformities noted bilateral. Muscular strength within normal limits.  ASSESSMENT 1.  Pain due to onychomycosis of toenails both  PLAN OF CARE 1. Patient evaluated today.  2. Instructed to maintain good pedal hygiene and foot care.  3. Mechanical debridement of nails 1-5 bilaterally performed using a nail nipper.  Filed with dremel without incident.  4. Return to clinic in 3 mos.    Felecia Shelling, DPM [facility]  Dr. Felecia Shelling, DPM    [address] [place], [place] [address]                Office [phone]  Fax [phone]

## 2024-11-24 ENCOUNTER — Ambulatory Visit: Admitting: Podiatry

## 2024-11-24 ENCOUNTER — Encounter: Payer: Self-pay | Admitting: Podiatry

## 2024-11-24 VITALS — Ht 61.0 in | Wt 103.0 lb

## 2024-11-24 DIAGNOSIS — M79674 Pain in right toe(s): Secondary | ICD-10-CM | POA: Diagnosis not present

## 2024-11-24 DIAGNOSIS — B351 Tinea unguium: Secondary | ICD-10-CM

## 2024-11-24 DIAGNOSIS — M79675 Pain in left toe(s): Secondary | ICD-10-CM

## 2024-11-24 NOTE — Progress Notes (Signed)
   Chief Complaint  Patient presents with   Nail Problem    Pt is here for [facility].    SUBJECTIVE Patient presents to office today complaining of elongated, thickened nails that cause pain while ambulating in shoes.  Patient is unable to trim their own nails. Patient is here for further evaluation and treatment.  Past Medical History:  Diagnosis Date   Abnormal EKG    "told bundle branch blockage"-EKG brought in 09-11-07 copy with chart   Arthritis    Bruises easily    hx. of. Larey Seat" knee gave way" Right cheek with recent suture removal   Diabetes mellitus without complication (HCC)    recently told- no meds as of yet   GERD (gastroesophageal reflux disease)    Hypertension    PONV (postoperative nausea and vomiting)    only with wisdom teeth extraction   Varicose veins 11-04-13   oral varicose veins-surgically excised   Varicose veins 11-04-13   bilateral legs-no problems-09-15-15 remains not a problem    Allergies  Allergen Reactions   Dilaudid [Hydromorphone] Nausea And Vomiting   Codeine Swelling and Rash     OBJECTIVE General Patient is awake, alert, and oriented x 3 and in no acute distress. Derm Skin is dry and supple bilateral. Negative open lesions or macerations. Remaining integument unremarkable. Nails are tender, long, thickened and dystrophic with subungual debris, consistent with onychomycosis, 1-5 bilateral. No signs of infection noted. Vasc  DP and PT pedal pulses palpable bilaterally. Temperature gradient within normal limits.  Neuro Epicritic and protective threshold sensation grossly intact bilaterally.  Musculoskeletal Exam No symptomatic pedal deformities noted bilateral. Muscular strength within normal limits.  ASSESSMENT 1.  Pain due to onychomycosis of toenails both  PLAN OF CARE 1. Patient evaluated today.  2. Instructed to maintain good pedal hygiene and foot care.  3. Mechanical debridement of nails 1-5 bilaterally performed using a nail nipper.  Filed with dremel without incident.  4. Return to clinic in 3 mos.    Felecia Shelling, DPM [facility]  Dr. Felecia Shelling, DPM    [address] [place], [place] [address]                Office [phone]  Fax [phone]

## 2025-02-23 ENCOUNTER — Ambulatory Visit: Admitting: Podiatry
